# Patient Record
Sex: Female | Born: 1935 | Race: Black or African American | Hispanic: No | Marital: Married
Health system: Midwestern US, Academic
[De-identification: ages and names within clinical notes are randomized; demographics above are authoritative.]

---

## 2018-03-18 ENCOUNTER — Emergency Department: Admit: 2018-03-18 | Discharge: 2018-03-18 | Payer: MEDICARE

## 2018-03-18 ENCOUNTER — Inpatient Hospital Stay
Admit: 2018-03-18 | Discharge: 2018-03-21 | Disposition: A | Discharge status: Home Health | Payer: MEDICARE | Admitting: Student in an Organized Health Care Education/Training Program

## 2018-03-18 ENCOUNTER — Encounter: Admit: 2018-03-18 | Discharge: 2018-03-18 | Payer: MEDICARE

## 2018-03-18 DIAGNOSIS — C8190 Hodgkin lymphoma, unspecified, unspecified site: ICD-10-CM

## 2018-03-18 DIAGNOSIS — I4892 Unspecified atrial flutter: Principal | ICD-10-CM

## 2018-03-18 DIAGNOSIS — I10 Essential (primary) hypertension: ICD-10-CM

## 2018-03-18 DIAGNOSIS — R531 Weakness: Principal | ICD-10-CM

## 2018-03-18 DIAGNOSIS — M1990 Unspecified osteoarthritis, unspecified site: ICD-10-CM

## 2018-03-18 DIAGNOSIS — E119 Type 2 diabetes mellitus without complications: ICD-10-CM

## 2018-03-19 ENCOUNTER — Encounter: Admit: 2018-03-19 | Discharge: 2018-03-19 | Payer: MEDICARE

## 2018-03-19 DIAGNOSIS — C8190 Hodgkin lymphoma, unspecified, unspecified site: ICD-10-CM

## 2018-03-19 DIAGNOSIS — E119 Type 2 diabetes mellitus without complications: ICD-10-CM

## 2018-03-19 DIAGNOSIS — M1990 Unspecified osteoarthritis, unspecified site: ICD-10-CM

## 2018-03-19 DIAGNOSIS — I10 Essential (primary) hypertension: ICD-10-CM

## 2018-03-19 DIAGNOSIS — I4892 Unspecified atrial flutter: Principal | ICD-10-CM

## 2018-03-19 LAB — LIPID PROFILE
Lab: 124 mg/dL
Lab: 13 mg/dL
Lab: 208 mg/dL — ABNORMAL HIGH (ref <200)
Lab: 65 mg/dL (ref <150)
Lab: 84 mg/dL (ref >40)
Lab: 91 mg/dL (ref <100)

## 2018-03-19 LAB — URINALYSIS DIPSTICK
Lab: 7 mg/dL (ref 5.0–8.0)
Lab: NEGATIVE MMOL/L (ref 21–30)
Lab: NEGATIVE U/L (ref 7–40)
Lab: NEGATIVE U/L (ref 7–56)
Lab: NEGATIVE mg/dL (ref 0.3–1.2)
Lab: NEGATIVE mg/dL (ref 0.4–1.00)
Lab: NEGATIVE mg/dL (ref 8.5–10.6)

## 2018-03-19 LAB — CBC AND DIFF
Lab: 0.1 10*3/uL (ref 0–0.20)
Lab: 0.3 10*3/uL (ref 0–0.45)
Lab: 7.2 10*3/uL (ref 4.5–11.0)
Lab: 0.1 10*3/uL (ref 0–0.45)
Lab: 0.8 10*3/uL (ref 0–0.80)
Lab: 1 % (ref 0–2)
Lab: 1.8 10*3/uL (ref 1.0–4.8)
Lab: 10 FL (ref 7–11)
Lab: 12 % (ref 4–12)
Lab: 12 g/dL (ref 12.0–15.0)
Lab: 14 % (ref 11–15)
Lab: 172 10*3/uL (ref 150–400)
Lab: 2 % (ref 0–5)
Lab: 28 % (ref 24–44)
Lab: 3.7 10*3/uL (ref 1.8–7.0)
Lab: 3.8 M/UL — ABNORMAL LOW (ref 4.0–5.0)
Lab: 32 g/dL (ref 32.0–36.0)
Lab: 57 % (ref 41–77)
Lab: 6.4 10*3/uL (ref 4.5–11.0)
Lab: 97 FL (ref 80–100)

## 2018-03-19 LAB — TROPONIN-I
Lab: 0 ng/mL (ref 0.0–0.05)
Lab: 0 ng/mL (ref 0.0–0.05)
Lab: 0 ng/mL (ref 0.0–0.05)
Lab: 0 ng/mL (ref 0.0–0.05)

## 2018-03-19 LAB — URINALYSIS, MICROSCOPIC

## 2018-03-19 LAB — COMPREHENSIVE METABOLIC PANEL
Lab: 11 10*3/uL (ref 3–12)
Lab: 137 MMOL/L (ref 137–147)
Lab: >60 mL/min (ref >60)
Lab: >60 mL/min (ref >60)

## 2018-03-19 LAB — POC GLUCOSE
Lab: 137 mg/dL — ABNORMAL HIGH (ref 70–100)
Lab: 230 mg/dL — ABNORMAL HIGH (ref 70–100)
Lab: 166 mg/dL — ABNORMAL HIGH (ref 70–100)

## 2018-03-19 MED ORDER — HEPARIN, PORCINE (PF) 5,000 UNIT/0.5 ML IJ SYRG
5000 [IU] | SUBCUTANEOUS | 0 refills | Status: DC
Start: 2018-03-19 — End: 2018-03-21
  Administered 2018-03-19 – 2018-03-20 (×5): 5000 [IU] via SUBCUTANEOUS

## 2018-03-19 MED ORDER — SENNOSIDES-DOCUSATE SODIUM 8.6-50 MG PO TAB
1 | Freq: Two times a day (BID) | ORAL | 0 refills | Status: DC
Start: 2018-03-19 — End: 2018-03-21
  Administered 2018-03-19 – 2018-03-20 (×2): 1 via ORAL

## 2018-03-19 MED ORDER — ATENOLOL 25 MG PO TAB
12.5 mg | Freq: Every day | ORAL | 0 refills | Status: DC
Start: 2018-03-19 — End: 2018-03-19
  Administered 2018-03-19: 15:00:00 12.5 mg via ORAL

## 2018-03-19 MED ORDER — METFORMIN 500 MG PO TAB
500 mg | Freq: Two times a day (BID) | ORAL | 0 refills | Status: DC
Start: 2018-03-19 — End: 2018-03-21
  Administered 2018-03-19 – 2018-03-20 (×4): 500 mg via ORAL

## 2018-03-19 MED ORDER — POLYETHYLENE GLYCOL 3350 17 GRAM PO PWPK
1 | Freq: Every day | ORAL | 0 refills | Status: DC
Start: 2018-03-19 — End: 2018-03-21

## 2018-03-19 MED ORDER — ASPIRIN 81 MG PO TBEC
81 mg | Freq: Every day | ORAL | 0 refills | Status: DC
Start: 2018-03-19 — End: 2018-03-21
  Administered 2018-03-19 – 2018-03-20 (×2): 81 mg via ORAL

## 2018-03-19 MED ORDER — ACETAMINOPHEN 325 MG PO TAB
650 mg | ORAL | 0 refills | Status: DC | PRN
Start: 2018-03-19 — End: 2018-03-21
  Administered 2018-03-19 – 2018-03-20 (×5): 650 mg via ORAL

## 2018-03-19 MED ORDER — INSULIN ASPART 100 UNIT/ML SC FLEXPEN
0-6 [IU] | Freq: Before meals | SUBCUTANEOUS | 0 refills | Status: DC
Start: 2018-03-19 — End: 2018-03-21
  Administered 2018-03-19: 19:00:00 2 [IU] via SUBCUTANEOUS

## 2018-03-19 MED ORDER — LACTATED RINGERS IV SOLP
INTRAVENOUS | 0 refills | Status: DC
Start: 2018-03-19 — End: 2018-03-21
  Administered 2018-03-19 – 2018-03-20 (×3): 1000.000 mL via INTRAVENOUS

## 2018-03-20 LAB — POC GLUCOSE
Lab: 138 mg/dL — ABNORMAL HIGH (ref 70–100)
Lab: 140 mg/dL — ABNORMAL HIGH (ref 70–100)
Lab: 159 mg/dL — ABNORMAL HIGH (ref 70–100)
Lab: 131 mg/dL — ABNORMAL HIGH (ref 70–100)

## 2018-03-20 LAB — COMPREHENSIVE METABOLIC PANEL: Lab: 139 MMOL/L — ABNORMAL HIGH (ref 137–147)

## 2018-03-20 LAB — CBC AND DIFF: Lab: 5.6 K/UL — ABNORMAL LOW (ref 4.5–11.0)

## 2018-03-20 MED ORDER — METFORMIN 500 MG PO TAB
500 mg | ORAL_TABLET | Freq: Two times a day (BID) | ORAL | 0 refills | Status: CN
Start: 2018-03-20 — End: ?

## 2018-03-20 MED ORDER — ATENOLOL 25 MG PO TAB
12.5 mg | Freq: Every day | ORAL | 0 refills | Status: DC
Start: 2018-03-20 — End: 2018-03-21
  Administered 2018-03-20: 18:00:00 12.5 mg via ORAL

## 2018-03-20 MED ORDER — DICLOFENAC SODIUM 1 % TP GEL
2 g | Freq: Three times a day (TID) | TOPICAL | 0 refills | 19.00000 days | Status: AC
Start: 2018-03-20 — End: ?

## 2018-03-20 MED ORDER — DICLOFENAC SODIUM 1 % TP GEL
2 g | Freq: Three times a day (TID) | TOPICAL | 0 refills | Status: DC
Start: 2018-03-20 — End: 2018-03-21
  Administered 2018-03-20: 21:00:00 2 g via TOPICAL

## 2018-03-20 MED ORDER — ATENOLOL 25 MG PO TAB
12.5 mg | ORAL_TABLET | Freq: Every day | ORAL | 1 refills | 33.00000 days | Status: AC
Start: 2018-03-20 — End: 2018-04-22

## 2018-03-20 MED ORDER — ASPIRIN 81 MG PO TBEC
81 mg | ORAL_TABLET | Freq: Every day | ORAL | 0 refills | Status: CN
Start: 2018-03-20 — End: ?

## 2018-03-21 DIAGNOSIS — K5900 Constipation, unspecified: ICD-10-CM

## 2018-03-21 DIAGNOSIS — I4892 Unspecified atrial flutter: ICD-10-CM

## 2018-03-21 DIAGNOSIS — Z66 Do not resuscitate: ICD-10-CM

## 2018-03-21 DIAGNOSIS — R531 Weakness: Principal | ICD-10-CM

## 2018-03-21 DIAGNOSIS — Z8571 Personal history of Hodgkin lymphoma: ICD-10-CM

## 2018-03-21 DIAGNOSIS — M47812 Spondylosis without myelopathy or radiculopathy, cervical region: ICD-10-CM

## 2018-03-21 DIAGNOSIS — N3946 Mixed incontinence: ICD-10-CM

## 2018-03-21 DIAGNOSIS — I10 Essential (primary) hypertension: ICD-10-CM

## 2018-03-21 DIAGNOSIS — T447X5A Adverse effect of beta-adrenoreceptor antagonists, initial encounter: ICD-10-CM

## 2018-03-21 DIAGNOSIS — G8929 Other chronic pain: ICD-10-CM

## 2018-03-21 DIAGNOSIS — E119 Type 2 diabetes mellitus without complications: ICD-10-CM

## 2018-04-02 ENCOUNTER — Encounter: Admit: 2018-04-02 | Discharge: 2018-04-02 | Payer: MEDICARE

## 2018-04-03 ENCOUNTER — Encounter: Admit: 2018-04-03 | Discharge: 2018-04-03 | Payer: MEDICARE

## 2018-04-03 DIAGNOSIS — C8190 Hodgkin lymphoma, unspecified, unspecified site: ICD-10-CM

## 2018-04-03 DIAGNOSIS — M1990 Unspecified osteoarthritis, unspecified site: ICD-10-CM

## 2018-04-03 DIAGNOSIS — I10 Essential (primary) hypertension: ICD-10-CM

## 2018-04-03 DIAGNOSIS — R42 Dizziness and giddiness: ICD-10-CM

## 2018-04-03 DIAGNOSIS — E119 Type 2 diabetes mellitus without complications: ICD-10-CM

## 2018-04-03 DIAGNOSIS — I4892 Unspecified atrial flutter: Principal | ICD-10-CM

## 2018-04-19 ENCOUNTER — Ambulatory Visit: Admit: 2018-04-19 | Discharge: 2018-04-19 | Payer: MEDICARE

## 2018-04-19 ENCOUNTER — Encounter: Admit: 2018-04-19 | Discharge: 2018-04-19 | Payer: MEDICARE

## 2018-04-19 DIAGNOSIS — Z0000 Encounter for general adult medical examination without abnormal findings: ICD-10-CM

## 2018-04-19 DIAGNOSIS — R5383 Other fatigue: Secondary | ICD-10-CM

## 2018-04-19 DIAGNOSIS — R0989 Other specified symptoms and signs involving the circulatory and respiratory systems: Secondary | ICD-10-CM

## 2018-04-19 DIAGNOSIS — E559 Vitamin D deficiency, unspecified: Secondary | ICD-10-CM

## 2018-04-19 DIAGNOSIS — I4892 Unspecified atrial flutter: Secondary | ICD-10-CM

## 2018-04-19 DIAGNOSIS — I10 Essential (primary) hypertension: Secondary | ICD-10-CM

## 2018-04-19 DIAGNOSIS — R42 Dizziness and giddiness: Secondary | ICD-10-CM

## 2018-04-19 DIAGNOSIS — C8190 Hodgkin lymphoma, unspecified, unspecified site: Secondary | ICD-10-CM

## 2018-04-19 DIAGNOSIS — M1990 Unspecified osteoarthritis, unspecified site: Secondary | ICD-10-CM

## 2018-04-19 DIAGNOSIS — E119 Type 2 diabetes mellitus without complications: Secondary | ICD-10-CM

## 2018-04-19 DIAGNOSIS — E785 Hyperlipidemia, unspecified: Secondary | ICD-10-CM

## 2018-04-19 LAB — CBC AND DIFF
Lab: 14 % (ref 11–15)
Lab: 210 K/UL (ref 150–400)
Lab: 31 pg (ref 26–34)
Lab: 32 g/dL (ref 32.0–36.0)
Lab: 39 % (ref 36–45)
Lab: 4 M/UL — ABNORMAL HIGH (ref 4.0–5.0)
Lab: 5.5 K/UL (ref 4.5–11.0)
Lab: 9.5 FL (ref 7–11)

## 2018-04-19 LAB — 25-OH VITAMIN D (D2 + D3): Lab: 31 ng/mL (ref 30–80)

## 2018-04-19 LAB — VITAMIN B12: Lab: 179 pg/mL — ABNORMAL HIGH (ref 180–914)

## 2018-04-19 LAB — COMPREHENSIVE METABOLIC PANEL
Lab: 140 MMOL/L (ref 137–147)
Lab: 4.2 MMOL/L (ref 3.5–5.1)

## 2018-04-19 LAB — TSH WITH FREE T4 REFLEX: Lab: 1.3 uU/mL (ref 0.35–5.00)

## 2018-04-19 MED ORDER — SIMVASTATIN 40 MG PO TAB
40 mg | ORAL_TABLET | Freq: Every evening | ORAL | 3 refills | Status: AC
Start: 2018-04-19 — End: 2018-06-18

## 2018-04-22 ENCOUNTER — Encounter: Admit: 2018-04-22 | Discharge: 2018-04-22 | Payer: MEDICARE

## 2018-04-22 DIAGNOSIS — E119 Type 2 diabetes mellitus without complications: Secondary | ICD-10-CM

## 2018-04-22 DIAGNOSIS — C8190 Hodgkin lymphoma, unspecified, unspecified site: Secondary | ICD-10-CM

## 2018-04-22 DIAGNOSIS — M1990 Unspecified osteoarthritis, unspecified site: Secondary | ICD-10-CM

## 2018-04-22 DIAGNOSIS — I10 Essential (primary) hypertension: Secondary | ICD-10-CM

## 2018-04-22 DIAGNOSIS — R42 Dizziness and giddiness: Secondary | ICD-10-CM

## 2018-04-22 DIAGNOSIS — I4892 Unspecified atrial flutter: Secondary | ICD-10-CM

## 2018-04-23 ENCOUNTER — Encounter: Admit: 2018-04-23 | Discharge: 2018-04-23 | Payer: MEDICARE

## 2018-04-24 ENCOUNTER — Encounter: Admit: 2018-04-24 | Discharge: 2018-04-24 | Payer: MEDICARE

## 2018-04-26 ENCOUNTER — Encounter: Admit: 2018-04-26 | Discharge: 2018-04-26 | Payer: MEDICARE

## 2018-04-26 DIAGNOSIS — I4892 Unspecified atrial flutter: Secondary | ICD-10-CM

## 2018-04-26 DIAGNOSIS — R42 Dizziness and giddiness: Secondary | ICD-10-CM

## 2018-04-26 DIAGNOSIS — I10 Essential (primary) hypertension: Secondary | ICD-10-CM

## 2018-04-26 DIAGNOSIS — E119 Type 2 diabetes mellitus without complications: Secondary | ICD-10-CM

## 2018-04-26 DIAGNOSIS — C8190 Hodgkin lymphoma, unspecified, unspecified site: Secondary | ICD-10-CM

## 2018-04-26 DIAGNOSIS — M1990 Unspecified osteoarthritis, unspecified site: Secondary | ICD-10-CM

## 2018-04-27 ENCOUNTER — Ambulatory Visit: Admit: 2018-04-26 | Discharge: 2018-04-27 | Payer: MEDICARE

## 2018-04-27 DIAGNOSIS — I4510 Unspecified right bundle-branch block: Secondary | ICD-10-CM

## 2018-04-27 DIAGNOSIS — I10 Essential (primary) hypertension: Secondary | ICD-10-CM

## 2018-04-27 DIAGNOSIS — R9431 Abnormal electrocardiogram [ECG] [EKG]: Secondary | ICD-10-CM

## 2018-05-09 ENCOUNTER — Encounter: Admit: 2018-05-09 | Discharge: 2018-05-09 | Payer: MEDICARE

## 2018-05-17 ENCOUNTER — Ambulatory Visit: Admit: 2018-05-17 | Discharge: 2018-05-17 | Payer: MEDICARE

## 2018-05-17 ENCOUNTER — Encounter: Admit: 2018-05-17 | Discharge: 2018-05-17 | Payer: MEDICARE

## 2018-05-17 DIAGNOSIS — R0989 Other specified symptoms and signs involving the circulatory and respiratory systems: Secondary | ICD-10-CM

## 2018-05-21 ENCOUNTER — Ambulatory Visit: Admit: 2018-05-21 | Discharge: 2018-05-21 | Payer: MEDICARE

## 2018-05-21 DIAGNOSIS — I10 Essential (primary) hypertension: Principal | ICD-10-CM

## 2018-05-21 DIAGNOSIS — R9431 Abnormal electrocardiogram [ECG] [EKG]: ICD-10-CM

## 2018-05-31 ENCOUNTER — Encounter: Admit: 2018-05-31 | Discharge: 2018-05-31 | Payer: MEDICARE

## 2018-05-31 DIAGNOSIS — E785 Hyperlipidemia, unspecified: Principal | ICD-10-CM

## 2018-05-31 MED ORDER — ROSUVASTATIN 10 MG PO TAB
10 mg | ORAL_TABLET | Freq: Every day | ORAL | 3 refills | 90.00000 days | Status: AC
Start: 2018-05-31 — End: 2018-10-29

## 2018-05-31 NOTE — Telephone Encounter
Called and spoke with pt and gave results of her [place] study.  Pt conveyed understanding.  The pt did, however, state that they haven't been taking atorvastatin as directed because of stomach upset.  Wondered if there was an alternative cholesterol med that wouldn't cause nausea.  Routing to advise.  CB Malani Lees LPN

## 2018-06-18 ENCOUNTER — Encounter: Admit: 2018-06-18 | Discharge: 2018-06-18 | Payer: MEDICARE

## 2018-06-18 ENCOUNTER — Ambulatory Visit: Admit: 2018-06-18 | Discharge: 2018-06-19 | Payer: MEDICARE

## 2018-06-18 DIAGNOSIS — I4892 Unspecified atrial flutter: Principal | ICD-10-CM

## 2018-06-18 DIAGNOSIS — R42 Dizziness and giddiness: ICD-10-CM

## 2018-06-18 DIAGNOSIS — E119 Type 2 diabetes mellitus without complications: ICD-10-CM

## 2018-06-18 DIAGNOSIS — M1990 Unspecified osteoarthritis, unspecified site: ICD-10-CM

## 2018-06-18 DIAGNOSIS — I10 Essential (primary) hypertension: ICD-10-CM

## 2018-06-18 DIAGNOSIS — C8190 Hodgkin lymphoma, unspecified, unspecified site: ICD-10-CM

## 2018-06-18 MED ORDER — ATENOLOL 25 MG PO TAB
25 mg | ORAL_TABLET | Freq: Two times a day (BID) | ORAL | 3 refills | 33.00000 days | Status: AC
Start: 2018-06-18 — End: ?

## 2018-06-18 NOTE — Progress Notes
Date of Service: 06/18/2018    Julie Dennis is a 84 y.o. female.       HPI   [name] is a very pleasant 84 year old lady, had been followed by Dr. Racheal Patches for many years.    There is history of questionable atrial flutter.  She has had an EKG with [place] in December and in January, both of them have shown sinus rhythm.  [name] does mention palpitations.  They occur frequently, more so in the mornings.  The heart rate is in the high 90s frequently.  She is currently on atenolol at 25 mg once a day, usually taken in the mornings.  She has taken additional atenolol as and when needed.  Recent TSH was within normal limits.     Recent echo has shown mild mitral stenosis due to mitral annular calcification with normal LV systolic function.    Her exertional capacity is limited due to severe C-spine issues.  She ambulates slowly with the help of a cane.             Vitals:    06/18/18 1321   BP: 104/58   BP Source: Arm, Left Upper   Pulse: 103   SpO2: 96%   Weight: 56.7 kg (125 lb)   Height: 1.676 m (5' 6)   PainSc: Zero     Body mass index is 20.18 kg/m???.     Past Medical History  Patient Active Problem List    Diagnosis Date Noted   ??? Decreased pulses in feet 04/19/2018   ??? Dizziness 04/03/2018   ??? Cervical spine arthritis 03/19/2018   ??? Essential hypertension 03/19/2018   ??? History of atrial flutter 03/19/2018   ??? Type 2 diabetes mellitus (HCC) 03/19/2018   ??? Constipation 03/19/2018   ??? Weakness 03/19/2018   ??? Mixed stress and urge urinary incontinence 03/19/2018         Review of Systems   Constitution: Negative.   HENT: Negative.    Eyes: Negative.    Cardiovascular: Negative.    Respiratory: Negative.    Endocrine: Negative.    Hematologic/Lymphatic: Negative.    Skin: Negative.    Musculoskeletal: Positive for neck pain.   Gastrointestinal: Positive for constipation.   Genitourinary: Negative.    Neurological: Negative.    Psychiatric/Behavioral: Negative.    Allergic/Immunologic: Negative.

## 2018-06-19 DIAGNOSIS — R002 Palpitations: Principal | ICD-10-CM

## 2018-06-19 DIAGNOSIS — Z8679 Personal history of other diseases of the circulatory system: ICD-10-CM

## 2018-07-02 ENCOUNTER — Encounter: Admit: 2018-07-02 | Discharge: 2018-07-02 | Payer: MEDICARE

## 2018-07-02 MED ORDER — GLYBURIDE 2.5 MG PO TAB
2.5 mg | ORAL_TABLET | Freq: Every day | ORAL | 1 refills | Status: AC
Start: 2018-07-02 — End: 2018-12-06

## 2018-08-28 ENCOUNTER — Encounter: Admit: 2018-08-28 | Discharge: 2018-08-28 | Payer: MEDICARE

## 2018-08-28 MED ORDER — LINAGLIPTIN 5 MG PO TAB
5 mg | ORAL_TABLET | Freq: Every day | ORAL | 1 refills | 30.00000 days | Status: DC
Start: 2018-08-28 — End: 2018-10-29

## 2018-08-28 NOTE — Telephone Encounter
Pt called to ask for refill on Tradjenta 5mg  tab    Last seen 04/19/2018  Next scheduled visit 10/21/2018    Routed to provider

## 2018-09-05 ENCOUNTER — Encounter: Admit: 2018-09-05 | Discharge: 2018-09-05 | Payer: MEDICARE

## 2018-10-08 ENCOUNTER — Encounter: Admit: 2018-10-08 | Discharge: 2018-10-08

## 2018-10-08 NOTE — Telephone Encounter
Pt called to see if she could get in w/ [facility] about some issues she's having w/Toenail fungus and a little leg swelling.   This location is more convenient for her because her husband's PCP is next door so she'd like to coordinate the visits.     I called her back, let her know I understand the convenience factor, but this is really best addressed w/ PCP, there are notes in chart from PCP on 5/21 about treating the fungus, but she needs visit.   She has Int Med appt scheduled on 7/8, I gave her that date/time/provider name.   She's not sure if she is going to keep that appt or not, doesn't want too much exposure to the virus.

## 2018-10-23 ENCOUNTER — Encounter: Admit: 2018-10-23 | Discharge: 2018-10-23

## 2018-10-23 DIAGNOSIS — Z7689 Persons encountering health services in other specified circumstances: Principal | ICD-10-CM

## 2018-10-23 DIAGNOSIS — R42 Dizziness and giddiness: Secondary | ICD-10-CM

## 2018-10-23 DIAGNOSIS — I10 Essential (primary) hypertension: Secondary | ICD-10-CM

## 2018-10-23 DIAGNOSIS — E1169 Type 2 diabetes mellitus with other specified complication: Secondary | ICD-10-CM

## 2018-10-23 DIAGNOSIS — I4892 Unspecified atrial flutter: Secondary | ICD-10-CM

## 2018-10-23 DIAGNOSIS — E119 Type 2 diabetes mellitus without complications: Secondary | ICD-10-CM

## 2018-10-23 DIAGNOSIS — C8190 Hodgkin lymphoma, unspecified, unspecified site: Secondary | ICD-10-CM

## 2018-10-23 DIAGNOSIS — Z8679 Personal history of other diseases of the circulatory system: Secondary | ICD-10-CM

## 2018-10-23 DIAGNOSIS — M1990 Unspecified osteoarthritis, unspecified site: Secondary | ICD-10-CM

## 2018-10-23 DIAGNOSIS — B351 Tinea unguium: Secondary | ICD-10-CM

## 2018-10-23 MED ORDER — CICLOPIROX 8 % TP SOLN
6.6 mL | Freq: Every evening | TOPICAL | 0 refills | Status: AC
Start: 2018-10-23 — End: ?

## 2018-10-23 NOTE — Progress Notes
Obtained patient's, or patient proxy's, verbal consent to treat them and their agreement to [facility] financial policy and NPP via this telehealth visit during the [facility] Emergency    Chief Complaint   Patient presents with   ??? Establish Care           History of Present Illness    Julie Dennis is 84 y.o. female patient who presents to clinic to establish care.   TOC from Dr. Clayton Lefort.      Patient has PMH of:  DM II- non-insulin dependent, DLD,  HTN, symptomatic sinus tachycardia- possible atrial flutter- following Cardiology.  She is on atenolol.  Denies any dizziness.      Reports compliance with diabetic medications.  Has not had A1c checked in a year.  Has not had eye exam or foot exam yet.  Would like to see Podiatry.  She had toenail fungus an this has fallen off.  She has been soaking her toes in vinegar and water.      She would like labs done at [facility] and [facility].        Medical History:   Diagnosis Date   ??? Arthritis    ??? Atrial flutter (HCC)    ??? Dizziness 04/03/2018   ??? DM (diabetes mellitus) (HCC)    ??? Hodgkin's lymphoma (HCC)    ??? Hypertension        No past surgical history on file.    family history includes Cancer-Lung in her father; High Cholesterol in her mother; Hypertension in her father and mother.    Social History     Socioeconomic History   ??? Marital status: Married     Spouse name: Not on file   ??? Number of children: Not on file   ??? Years of education: Not on file   ??? Highest education level: Not on file   Occupational History   ??? Not on file   Tobacco Use   ??? Smoking status: Never Smoker   ??? Smokeless tobacco: Never Used   Substance and Sexual Activity   ??? Alcohol use: Not Currently     Frequency: Never   ??? Drug use: Never   ??? Sexual activity: Not on file   Other Topics Concern   ??? Not on file   Social History Narrative   ??? Not on file       Social History     Tobacco Use   ??? Smoking status: Never Smoker   ??? Smokeless tobacco: Never Used   Substance Use Topics ??? Alcohol use: Not Currently     Frequency: Never          Review of Systems   Constitutional: Negative for chills and fever.   HENT: Negative for congestion.    Respiratory: Negative for shortness of breath.    Cardiovascular: Negative for chest pain and palpitations.   Gastrointestinal: Negative for abdominal distention, abdominal pain, constipation, diarrhea and nausea.   Skin:        Toenail change   Neurological: Negative for dizziness and light-headedness.         Objective:         ??? acetaminophen SR (TYLENOL) 650 mg tablet Take 650 mg by mouth every 6 hours as needed for Pain.   ??? aspirin EC 81 mg tablet Take 81 mg by mouth twice daily. Take with food.    ??? atenoloL (TENORMIN) 25 mg tablet Take one tablet by mouth twice daily.   ???  cholecalciferol (VITAMIN D) 1,000 units tablet Take 1,000 Units by mouth. 3-4 times weekly   ??? ciclopirox (PENLAC) 8 % topical solution Apply 6.6 mL topically to affected area at bedtime daily.   ??? diclofenac (VOLTAREN) 1 % topical gel Apply two g topically to affected area three times daily.   ??? FLAXSEED PO Take 2,800 mg by mouth daily.   ??? glucosamine 500 mg tab Take 500 mg by mouth daily.   ??? glyBURIDE (DIABETA) 2.5 mg tablet Take one tablet by mouth daily with breakfast. Take with food.   ??? latanoprost (XALATAN) 0.005 % ophthalmic solution    ??? linaGLIPtin (TRADJENTA) 5 mg tablet Take one tablet by mouth daily.   ??? menthol (BIOFREEZE (MENTHOL)) 4 % gel Apply  topically to affected area as Needed.   ??? metFORMIN-ER (FORTAMET) 500 mg extended release tablet Take 500 mg by mouth twice daily. Indications: Takes once daily   ??? MULTIVITAMIN PO Take 1 tablet by mouth daily.   ??? rosuvastatin (CRESTOR) 10 mg tablet Take one tablet by mouth daily.   ??? senna/docusate (SENOKOT-S) 8.6/50 mg tablet Take 2 tablets by mouth twice daily.     Vitals:    10/23/18 1039   BP: 136/73   Temp: 36.7 ???C (98 ???F)   TempSrc: Oral   Weight: 54.4 kg (120 lb)   Height: 165.1 cm (65)   PainSc: Zero Vitals:    10/23/18 1039   BP: 136/73   Temp: 36.7 ???C (98 ???F)   TempSrc: Oral   Weight: 54.4 kg (120 lb)   Height: 165.1 cm (65)   PainSc: Zero       Body mass index is 19.97 kg/m???.     Physical Exam  Constitutional:       General: She is not in acute distress.     Appearance: Normal appearance. She is well-developed. She is not ill-appearing, toxic-appearing or diaphoretic.   HENT:      Head: Normocephalic and atraumatic.      Nose: Nose normal.   Eyes:      Conjunctiva/sclera: Conjunctivae normal.   Neck:      Musculoskeletal: Normal range of motion.   Pulmonary:      Effort: Pulmonary effort is normal.   Musculoskeletal: Normal range of motion.   Skin:     General: Skin is dry.   Neurological:      Mental Status: She is alert and oriented to person, place, and time.   Psychiatric:         Behavior: Behavior normal.         Labwork reviewed:  Lab Results   Component Value Date/Time    TSH 1.37 04/19/2018 01:25 PM    CHOL 208 (H) 03/19/2018 02:50 AM    TRIG 65 03/19/2018 02:50 AM    HDL 84 03/19/2018 02:50 AM    LDL 91 03/19/2018 02:50 AM    NA 140 04/19/2018 01:25 PM    K 4.2 04/19/2018 01:25 PM    CL 103 04/19/2018 01:25 PM    CO2 29 04/19/2018 01:25 PM    GAP 8 04/19/2018 01:25 PM    BUN 21 04/19/2018 01:25 PM    CR 0.79 04/19/2018 01:25 PM    GLU 203 (H) 04/19/2018 01:25 PM    CA 9.4 04/19/2018 01:25 PM    ALBUMIN 4.2 04/19/2018 01:25 PM    TOTPROT 7.3 04/19/2018 01:25 PM    ALKPHOS 63 04/19/2018 01:25 PM    AST 25 04/19/2018 01:25 PM  ALT 19 04/19/2018 01:25 PM    TOTBILI 0.4 04/19/2018 01:25 PM    GFR >60 04/19/2018 01:25 PM    GFRAA >60 04/19/2018 01:25 PM            Assessment and Plan:  Julie Dennis is a 84 y.o. female who presents 10/23/2018 for   Chief Complaint   Patient presents with   ??? Establish Care      Julie Dennis was seen today for establish care.    Diagnoses and all orders for this visit:    Encounter to establish care    Essential hypertension -     COMPREHENSIVE METABOLIC PANEL; Future; Expected date: 10/23/2018    Type 2 diabetes mellitus with other specified complication, unspecified whether long term insulin use (HCC)  -     HEMOGLOBIN A1C; Future; Expected date: 10/23/2018  -     MICROALB/CR RATIO-URINE RANDOM; Future; Expected date: 10/23/2018  -     AMB REFERRAL TO PODIATRY-EXTERNAL  - she is on metformin and Trajenda.      History of atrial flutter  - following Cardiology, on atenolol, not on AC    Onychomycosis  -     AMB REFERRAL TO PODIATRY-EXTERNAL  - patient reports has improved since vinegar soaks  - does not want to have to go to lab for monitoring of labs on terbinafine  -     ciclopirox (PENLAC) 8 % topical solution; Apply 6.6 mL topically to affected area at bedtime daily.    Hyperlipidemia, unspecified hyperlipidemia type  -     LIPID PROFILE; Future; Expected date: 10/23/2018    PAD (peripheral artery disease) (HCC)  - mild, on statin and ASA    Total time 40 minutes.  Estimated counseling time 25 minutes.      Patient Instructions   Thank you meeting with me virtually today.  I appreciate the opportunity to care for you.     Our plan from this visit:  ? Fasting labs  ? Podiatry referral      The phone number for my nurse, Luther Parody is [phone].  Our Fax number is [phone].  If you need to schedule a Same Day Visit with me for a sick visit, please call [phone]- option [phone].    You can also reach me by sending an email message through MyChart email that links directly to your chart.  If you are on Mychart patient portal, please expect to receive your results and advice about your results on Mychart at [email or web address]  If you have multiple questions or would like to have a discussion regarding lab results or further treatment, please call to create an appointment so we can discuss in person as your time is important to me as is my other patients' time.      If you are not on Mychart patient portal, non urgent results will be relayed to you through a letter.  Urgent results will be called to you.  Please make sure your address and phone number are correct.       Your time is important and if you had to wait at all today, I apologize. My goal is to run exactly on time; however, on occasion, I get behind in clinic due to unexpected patient issues.      Take care,  Dr. Gerhard Munch, MD and the General Internal Medicine Clinic, [facility]. [phone]    Podiatry information:  ???  ???  [facility]  [address]  [facility] [address]  [facility]   [place] [address]  [phone]  ???  [facility]   Dr. Dot Lanes   [address]   [place], [place] [address]   [phone]  ???  Podiatry associates  Dr. Benjaman Pott and Dr. Mervyn Skeeters [facility]   [facility]   [place] [facility]  [address]  [place], [place] [address]    [phone]  ???  Podiatric Physician and Foot and Ankle Surgeon   Dr Bonnell Public and Dr. Patrcia Dolly  [address]   [place], [place] [address]   [phone]  ???  [facility]  [address], [place]  [place]  [facility]   [address], [place], [place]   [address] [facility]  ???  [facility]  [address]  [place], [place] [address]   [phone]  ???  [facility]   [phone]  [place], [place] [address]  Brion Aliment, MD   ???  [facility]   [address] Freada Bergeron [address] [place], [phone]  [place], Mordecai Maes DPM        Return in about 3 months (around 01/23/2019) for Follow-up 40 min, Either Telehealth or Office visit OK.         Dierdre Harness, MD  [facility]

## 2018-10-24 ENCOUNTER — Ambulatory Visit: Admit: 2018-10-23 | Discharge: 2018-10-24

## 2018-10-24 DIAGNOSIS — E785 Hyperlipidemia, unspecified: Secondary | ICD-10-CM

## 2018-10-24 DIAGNOSIS — I739 Peripheral vascular disease, unspecified: Secondary | ICD-10-CM

## 2018-10-27 ENCOUNTER — Encounter: Admit: 2018-10-27 | Discharge: 2018-10-27

## 2018-10-27 DIAGNOSIS — R42 Dizziness and giddiness: Secondary | ICD-10-CM

## 2018-10-27 DIAGNOSIS — E119 Type 2 diabetes mellitus without complications: Secondary | ICD-10-CM

## 2018-10-27 DIAGNOSIS — C8190 Hodgkin lymphoma, unspecified, unspecified site: Secondary | ICD-10-CM

## 2018-10-27 DIAGNOSIS — I4892 Unspecified atrial flutter: Secondary | ICD-10-CM

## 2018-10-27 DIAGNOSIS — M1990 Unspecified osteoarthritis, unspecified site: Secondary | ICD-10-CM

## 2018-10-27 DIAGNOSIS — I10 Essential (primary) hypertension: Secondary | ICD-10-CM

## 2018-10-29 ENCOUNTER — Encounter: Admit: 2018-10-29 | Discharge: 2018-10-29

## 2018-10-29 DIAGNOSIS — E785 Hyperlipidemia, unspecified: Secondary | ICD-10-CM

## 2018-10-29 MED ORDER — ROSUVASTATIN 10 MG PO TAB
10 mg | ORAL_TABLET | Freq: Every day | ORAL | 1 refills | 90.00000 days | Status: AC
Start: 2018-10-29 — End: ?

## 2018-10-29 MED ORDER — LINAGLIPTIN 5 MG PO TAB
5 mg | ORAL_TABLET | Freq: Every day | ORAL | 1 refills | 90.00000 days | Status: DC
Start: 2018-10-29 — End: 2018-12-06

## 2018-10-29 NOTE — Telephone Encounter
Refill request from patient for rosuvastatin 10mg  tab.     Last fill: 05/31/2018  Qty/Refill: #30/3  LOV: 10/23/2018  NOV:12/05/2018    Hepatic Function    Lab Results   Component Value Date/Time    ALBUMIN 4.2 04/19/2018 01:25 PM    TOTPROT 7.3 04/19/2018 01:25 PM    ALKPHOS 63 04/19/2018 01:25 PM    Lab Results   Component Value Date/Time    AST 25 04/19/2018 01:25 PM    ALT 19 04/19/2018 01:25 PM    TOTBILI 0.4 04/19/2018 01:25 PM        Last LFT's within normal limits.       Medication on General Medicine protocol list. Will refill today.     Refill request from patient for Tradjenta 5mg  tab    Last fill: 08/28/2018  Qty/Refill: #30/1  LOV: 10/23/2018  NOV:12/05/2018    Medication not on General Medicine protocol list. Will route to Dr. Kathe Becton to approve/deny.        Malva Cogan, RN

## 2018-11-04 ENCOUNTER — Encounter: Admit: 2018-11-04 | Discharge: 2018-11-04

## 2018-11-04 NOTE — Telephone Encounter
Pt called on Saturday to discuss testing. Spoke with pt, she stated she found out information regarding COVID testing and was instructed by health department she does not need to be tested unless she has symptoms. Provided pt wish [place] COVID line to call with further questions about COVID. Pt would like to switch her appt with [facility] to telehealth.

## 2018-11-13 ENCOUNTER — Encounter: Admit: 2018-11-13 | Discharge: 2018-11-13

## 2018-11-13 NOTE — Progress Notes
Left message to make sure pt had zoom app downloaded to smart device being used for telehealth appt with Dr Curly Shores on 11-18-2018 also reminded her that we will call 15 min prior to appt to get started on the visit.

## 2018-11-19 ENCOUNTER — Encounter: Admit: 2018-11-19 | Discharge: 2018-11-19

## 2018-11-19 ENCOUNTER — Emergency Department: Admit: 2018-11-19 | Discharge: 2018-11-19

## 2018-11-19 DIAGNOSIS — G9340 Encephalopathy, unspecified: Secondary | ICD-10-CM

## 2018-11-19 DIAGNOSIS — I480 Paroxysmal atrial fibrillation: Secondary | ICD-10-CM

## 2018-11-19 DIAGNOSIS — M1990 Unspecified osteoarthritis, unspecified site: Secondary | ICD-10-CM

## 2018-11-19 DIAGNOSIS — R4689 Other symptoms and signs involving appearance and behavior: Secondary | ICD-10-CM

## 2018-11-19 DIAGNOSIS — I10 Essential (primary) hypertension: Secondary | ICD-10-CM

## 2018-11-19 DIAGNOSIS — R4182 Altered mental status, unspecified: Principal | ICD-10-CM

## 2018-11-19 DIAGNOSIS — G479 Sleep disorder, unspecified: Secondary | ICD-10-CM

## 2018-11-19 DIAGNOSIS — E119 Type 2 diabetes mellitus without complications: Secondary | ICD-10-CM

## 2018-11-19 DIAGNOSIS — E1169 Type 2 diabetes mellitus with other specified complication: Secondary | ICD-10-CM

## 2018-11-19 DIAGNOSIS — C8190 Hodgkin lymphoma, unspecified, unspecified site: Secondary | ICD-10-CM

## 2018-11-19 DIAGNOSIS — I4892 Unspecified atrial flutter: Secondary | ICD-10-CM

## 2018-11-19 DIAGNOSIS — R42 Dizziness and giddiness: Secondary | ICD-10-CM

## 2018-11-19 DIAGNOSIS — F29 Unspecified psychosis not due to a substance or known physiological condition: Secondary | ICD-10-CM

## 2018-11-19 LAB — OXYCODONE URINE SCREEN: Lab: NEGATIVE

## 2018-11-19 LAB — ACETAMINOPHEN LEVEL: Lab: <10 ug/mL (ref <20.1)

## 2018-11-19 LAB — URINALYSIS DIPSTICK REFLEX TO CULTURE
Lab: NEGATIVE 10*3/uL (ref 0–0.20)
Lab: NEGATIVE mL/min (ref 0–0.80)
Lab: NEGATIVE

## 2018-11-19 LAB — URINALYSIS MICROSCOPIC REFLEX TO CULTURE

## 2018-11-19 LAB — COMPREHENSIVE METABOLIC PANEL
Lab: 139 MMOL/L (ref 137–147)
Lab: 8 g/dL (ref 6.0–8.0)

## 2018-11-19 LAB — POC GLUCOSE
Lab: 193 mg/dL — ABNORMAL HIGH (ref 70–100)
Lab: 122 mg/dL — ABNORMAL HIGH (ref 70–100)

## 2018-11-19 LAB — CBC AND DIFF: Lab: 6.1 10*3/uL (ref 4.5–11.0)

## 2018-11-19 LAB — SALICYLATE LEVEL

## 2018-11-19 LAB — TSH WITH FREE T4 REFLEX: Lab: 1.7 uU/mL — AB (ref 0.35–5.00)

## 2018-11-19 LAB — BETA HYDROXYBUTYRATE (KETONES): Lab: 0.6 MMOL/L — ABNORMAL HIGH (ref <0.3)

## 2018-11-19 LAB — OPIATES 300 OR GREATER-URINE RANDOM: Lab: NEGATIVE

## 2018-11-19 LAB — CANNABINOIDS-URINE RANDOM: Lab: NEGATIVE

## 2018-11-19 LAB — BNP (B-TYPE NATRIURETIC PEPTI): Lab: 247 pg/mL — ABNORMAL HIGH (ref 0–100)

## 2018-11-19 LAB — BENZODIAZEPINES-URINE RANDOM: Lab: NEGATIVE

## 2018-11-19 LAB — ALCOHOL LEVEL: Lab: <10 mg/dL

## 2018-11-19 LAB — COVID-19 (SARS-COV-2) PCR

## 2018-11-19 LAB — METHADONE-URINE SCREEN: Lab: NEGATIVE

## 2018-11-19 LAB — MAGNESIUM: Lab: 1.7 mg/dL (ref 1.6–2.6)

## 2018-11-19 LAB — COCAINE-URINE RANDOM: Lab: NEGATIVE

## 2018-11-19 LAB — POC TROPONIN: Lab: 0 ng/mL (ref 0.00–0.05)

## 2018-11-19 LAB — POC LACTATE: Lab: 1.3 MMOL/L (ref 0.5–2.0)

## 2018-11-19 LAB — AMPHETAMINES-URINE RANDOM: Lab: NEGATIVE

## 2018-11-19 LAB — PHENCYCLIDINES-URINE RANDOM: Lab: NEGATIVE

## 2018-11-19 LAB — BARBITURATES-URINE RANDOM: Lab: NEGATIVE

## 2018-11-19 MED ORDER — ACETAMINOPHEN 325 MG PO TAB
650 mg | ORAL | 0 refills | Status: DC | PRN
Start: 2018-11-19 — End: 2018-11-27
  Administered 2018-11-22 – 2018-11-26 (×3): 650 mg via ORAL

## 2018-11-19 MED ORDER — HYDRALAZINE 20 MG/ML IJ SOLN
10 mg | INTRAVENOUS | 0 refills | Status: DC | PRN
Start: 2018-11-19 — End: 2018-11-20

## 2018-11-19 MED ORDER — CHOLECALCIFEROL (VITAMIN D3) 25 MCG (1,000 UNIT) PO TAB
1000 [IU] | Freq: Every day | ORAL | 0 refills | Status: DC
Start: 2018-11-19 — End: 2018-11-27
  Administered 2018-11-21 – 2018-11-27 (×7): 1000 [IU] via ORAL

## 2018-11-19 MED ORDER — MELATONIN 3 MG PO TAB
3 mg | Freq: Every evening | ORAL | 0 refills | Status: DC | PRN
Start: 2018-11-19 — End: 2018-11-27

## 2018-11-19 MED ORDER — LATANOPROST 0.005 % OP DROP
1 [drp] | Freq: Every evening | OPHTHALMIC | 0 refills | Status: DC
Start: 2018-11-19 — End: 2018-11-27
  Administered 2018-11-20: 03:00:00 1 [drp] via OPHTHALMIC

## 2018-11-19 MED ORDER — LACTATED RINGERS IV SOLP
30 mL/kg | Freq: Once | INTRAVENOUS | 0 refills | Status: CP
Start: 2018-11-19 — End: ?
  Administered 2018-11-19: 15:00:00 1632 mL via INTRAVENOUS

## 2018-11-19 MED ORDER — OLANZAPINE 10 MG IM SOLR
2.5 mg | INTRAMUSCULAR | 0 refills | Status: DC | PRN
Start: 2018-11-19 — End: 2018-11-20

## 2018-11-19 MED ORDER — ATENOLOL 50 MG PO TAB
25 mg | Freq: Two times a day (BID) | ORAL | 0 refills | Status: DC
Start: 2018-11-19 — End: 2018-11-27
  Administered 2018-11-19 – 2018-11-27 (×15): 25 mg via ORAL

## 2018-11-19 MED ORDER — THIAMINE 100MG IVPB
500 mg | Freq: Once | INTRAVENOUS | 0 refills | Status: CP
Start: 2018-11-19 — End: ?
  Administered 2018-11-19 (×2): 500 mg via INTRAVENOUS

## 2018-11-19 MED ORDER — ENOXAPARIN 40 MG/0.4 ML SC SYRG
40 mg | Freq: Every day | SUBCUTANEOUS | 0 refills | Status: DC
Start: 2018-11-19 — End: 2018-11-27
  Administered 2018-11-20 – 2018-11-27 (×7): 40 mg via SUBCUTANEOUS

## 2018-11-19 MED ORDER — ASPIRIN 81 MG PO TBEC
81 mg | Freq: Two times a day (BID) | ORAL | 0 refills | Status: DC
Start: 2018-11-19 — End: 2018-11-27
  Administered 2018-11-21 – 2018-11-27 (×13): 81 mg via ORAL

## 2018-11-19 MED ORDER — DOCUSATE SODIUM 100 MG PO CAP
100 mg | Freq: Every day | ORAL | 0 refills | Status: DC | PRN
Start: 2018-11-19 — End: 2018-11-27
  Administered 2018-11-21 (×2): 100 mg via ORAL

## 2018-11-19 MED ORDER — ROSUVASTATIN 20 MG PO TAB
10 mg | Freq: Every day | ORAL | 0 refills | Status: DC
Start: 2018-11-19 — End: 2018-11-27
  Administered 2018-11-19 – 2018-11-27 (×8): 10 mg via ORAL

## 2018-11-19 MED ORDER — MELATONIN 3 MG PO TAB
3 mg | Freq: Every evening | ORAL | 0 refills | Status: DC
Start: 2018-11-19 — End: 2018-11-27
  Administered 2018-11-20 – 2018-11-27 (×8): 3 mg via ORAL

## 2018-11-19 MED ORDER — SENNOSIDES-DOCUSATE SODIUM 8.6-50 MG PO TAB
2 | Freq: Two times a day (BID) | ORAL | 0 refills | Status: DC
Start: 2018-11-19 — End: 2018-11-27
  Administered 2018-11-20 – 2018-11-27 (×10): 2 via ORAL

## 2018-11-19 MED ORDER — INSULIN ASPART 100 UNIT/ML SC FLEXPEN
0-6 [IU] | Freq: Before meals | SUBCUTANEOUS | 0 refills | Status: DC
Start: 2018-11-19 — End: 2018-11-27
  Administered 2018-11-19: 21:00:00 0 [IU] via SUBCUTANEOUS

## 2018-11-19 MED ORDER — ASPIRIN 81 MG PO TBEC
81 mg | Freq: Two times a day (BID) | ORAL | 0 refills | Status: DC
Start: 2018-11-19 — End: 2018-11-19

## 2018-11-19 MED ORDER — THIAMINE MONONITRATE (VIT B1) 100 MG PO TAB
100 mg | Freq: Every day | ORAL | 0 refills | Status: DC
Start: 2018-11-19 — End: 2018-11-27
  Administered 2018-11-21 – 2018-11-27 (×7): 100 mg via ORAL

## 2018-11-19 MED ORDER — SODIUM CHLORIDE 0.9 % IV SOLP
INTRAVENOUS | 0 refills | Status: DC
Start: 2018-11-19 — End: 2018-11-19
  Administered 2018-11-19: 21:00:00 1000.000 mL via INTRAVENOUS

## 2018-11-19 MED ORDER — ATENOLOL 50 MG PO TAB
25 mg | Freq: Two times a day (BID) | ORAL | 0 refills | Status: DC
Start: 2018-11-19 — End: 2018-11-19

## 2018-11-19 MED ORDER — METOPROLOL TARTRATE 5 MG/5 ML IV SOLN
5 mg | INTRAVENOUS | 0 refills | Status: DC
Start: 2018-11-19 — End: 2018-11-19

## 2018-11-19 MED ORDER — ASPIRIN 325 MG PO TAB
325 mg | Freq: Once | ORAL | 0 refills | Status: CP
Start: 2018-11-19 — End: ?
  Administered 2018-11-19: 23:00:00 325 mg via ORAL

## 2018-11-19 MED ORDER — OLANZAPINE 5 MG PO TBDI
2.5 mg | ORAL | 0 refills | Status: DC | PRN
Start: 2018-11-19 — End: 2018-11-20
  Administered 2018-11-19 – 2018-11-20 (×2): 2.5 mg via ORAL

## 2018-11-19 NOTE — Progress Notes
Telehealth Visit Note    Date of Service: 11/19/2018    Subjective:      Obtained patient's verbal consent to treat them and their agreement to [facility] financial policy and NPP via this telehealth visit during the [facility] Emergency       Julie Dennis is a 84 y.o. female.    History of Present Illness  Patient complains of spastic movement, confusion, AMS, a some falls. Pt son states this has been going on a few weeks. He is unsure if she is taking her meds or her husbands. He is worried about dementia    ROS and PE not completed. Son on [place]. And patient in background walking around. Gait did not appear stable.        Review of Systems      Objective:         ??? acetaminophen SR (TYLENOL) 650 mg tablet Take 650 mg by mouth every 6 hours as needed for Pain.   ??? aspirin EC 81 mg tablet Take 81 mg by mouth twice daily. Take with food.    ??? atenoloL (TENORMIN) 25 mg tablet Take one tablet by mouth twice daily.   ??? cholecalciferol (VITAMIN D) 1,000 units tablet Take 1,000 Units by mouth. 3-4 times weekly   ??? ciclopirox (PENLAC) 8 % topical solution Apply 6.6 mL topically to affected area at bedtime daily.   ??? diclofenac (VOLTAREN) 1 % topical gel Apply two g topically to affected area three times daily.   ??? FLAXSEED PO Take 2,800 mg by mouth daily.   ??? glucosamine 500 mg tab Take 500 mg by mouth daily.   ??? glyBURIDE (DIABETA) 2.5 mg tablet Take one tablet by mouth daily with breakfast. Take with food.   ??? latanoprost (XALATAN) 0.005 % ophthalmic solution    ??? linaGLIPtin (TRADJENTA) 5 mg tablet Take one tablet by mouth daily.   ??? menthol (BIOFREEZE (MENTHOL)) 4 % gel Apply  topically to affected area as Needed.   ??? metFORMIN-ER (FORTAMET) 500 mg extended release tablet Take 500 mg by mouth twice daily. Indications: Takes once daily   ??? MULTIVITAMIN PO Take 1 tablet by mouth daily.   ??? rosuvastatin (CRESTOR) 10 mg tablet Take one tablet by mouth daily. ??? senna/docusate (SENOKOT-S) 8.6/50 mg tablet Take 2 tablets by mouth twice daily.     There were no vitals filed for this visit.  There is no height or weight on file to calculate BMI.     Physical Exam         Assessment and Plan:  There are no diagnoses linked to this encounter.               There are no Patient Instructions on file for this visit.           7 minutes spent on this patient's encounter with counseling and coordination of care taking >50% of the visit.

## 2018-11-19 NOTE — Progress Notes
Patient arrived to room # [id number]*) via cart accompanied by RN. Patient transferred to the bed with assistance. Bedside safety checks completed. Initial patient assessment completed. Refer to flowsheet for details.    Admission skin assessment completed with: Apolonio Schneiders, RN    Pressure injury present on arrival?: Yes    1. Head/Face/Neck: No  2. Trunk/Back: No  3. Upper Extremities: No  4. Lower Extremities: No  5. Pelvic/Coccyx: Yes  6. Assessed for device associated injury? Yes  7. Malnutrition Screening Tool (Nursing Nutrition Assessment) Completed? Yes    See Doc Flowsheet for additional wound details.     INTERVENTIONS:

## 2018-11-19 NOTE — Patient Instructions
Please go to ED or call PCP. Pt needs further evaluation of labs, CT scan, UA to determine if there is an Acute cause for AMS

## 2018-11-19 NOTE — ED Notes
This RN to bedside. Pt is crying and tachycardic. Pt states that she thought that she saw roaches and rats crawling on the walls. This RN reassured pt at this time, pt is now becoming calmer after she was reoriented.

## 2018-11-19 NOTE — ED Notes
Pt now reporting that he dog talks to her as well as angels.

## 2018-11-19 NOTE — ED Notes
This RN to bedside with ED tech. Removed brief, peri care completed and Pure[place] put in place.

## 2018-11-19 NOTE — ED Triage Notes
Pt here today from home via EMS c/c AMS. Pt lives at home and the family states that pt was not acting right. Pt knows name and is now slow to recall date and situation. Pt is tangential and singing songs. Dr. Tereasa Coop at bedside. Pt connected to monitoring, changed into gown, bed in lowest and locked position.     Belongings: nightgown, head wrap, brown hospital style socks    Medical History:   Diagnosis Date    Arthritis     Atrial flutter ([place])     Dizziness 04/03/2018    DM (diabetes mellitus) ([place])     Hodgkin's lymphoma ([place])     Hypertension

## 2018-11-19 NOTE — ED Notes
Pt heard screaming from room. ED staff to bedside. Pt was crying and was having hallucinations again. PRN Zyprexa ordered, will administered when available.

## 2018-11-19 NOTE — ED Notes
Pt has become more lucid and is no longer hallucinating. Pt is continuing to sign and hum to self. Pt updated on plan of care and provided with warm blankets.

## 2018-11-20 LAB — POC GLUCOSE
Lab: 180 mg/dL — ABNORMAL HIGH (ref 70–100)
Lab: 167 mg/dL — ABNORMAL HIGH (ref 70–100)
Lab: 135 mg/dL — ABNORMAL HIGH (ref 70–100)
Lab: 183 mg/dL — ABNORMAL HIGH (ref 70–100)

## 2018-11-20 LAB — TROPONIN-I
Lab: 0 ng/mL (ref 0.0–0.05)
Lab: 0 ng/mL — ABNORMAL HIGH (ref 0.0–0.05)
Lab: 0 ng/mL (ref 0.0–0.05)

## 2018-11-20 LAB — BASIC METABOLIC PANEL
Lab: 0.7 mg/dL (ref 0.4–1.00)
Lab: 141 MMOL/L (ref 137–147)
Lab: 150 mg/dL — ABNORMAL HIGH (ref 70–100)
Lab: 20 mg/dL (ref 7–25)
Lab: 3.3 MMOL/L — ABNORMAL LOW (ref 3.5–5.1)
Lab: >60 mL/min (ref >60)
Lab: 12 (ref 3–12)

## 2018-11-20 LAB — CBC AND DIFF
Lab: 4.8 M/UL — ABNORMAL LOW (ref >60)
Lab: 6.5 K/UL — ABNORMAL LOW (ref >60)

## 2018-11-20 LAB — D-DIMER: Lab: 189 ng{FEU}/mL — ABNORMAL HIGH (ref <500)

## 2018-11-20 LAB — AMMONIA: Lab: 21 umol/L (ref 9–35)

## 2018-11-20 MED ORDER — LACTATED RINGERS IV SOLP
INTRAVENOUS | 0 refills | Status: DC
Start: 2018-11-20 — End: 2018-11-21
  Administered 2018-11-20 – 2018-11-21 (×2): 1000.000 mL via INTRAVENOUS

## 2018-11-20 MED ORDER — OLANZAPINE 5 MG PO TBDI
5-10 mg | ORAL | 0 refills | Status: DC | PRN
Start: 2018-11-20 — End: 2018-11-20

## 2018-11-20 MED ORDER — LORAZEPAM 2 MG/ML IJ SOLN
.5 mg | Freq: Once | INTRAMUSCULAR | 0 refills | Status: CP
Start: 2018-11-20 — End: ?
  Administered 2018-11-20: 20:00:00 0.5 mg via INTRAMUSCULAR

## 2018-11-20 MED ORDER — LORAZEPAM 0.5 MG PO TAB
.5-1 mg | ORAL | 0 refills | Status: DC | PRN
Start: 2018-11-20 — End: 2018-11-27
  Administered 2018-11-24 – 2018-11-25 (×2): 0.5 mg via ORAL

## 2018-11-20 MED ORDER — LORAZEPAM 2 MG/ML IJ SOLN
.5-1 mg | INTRAMUSCULAR | 0 refills | Status: DC | PRN
Start: 2018-11-20 — End: 2018-11-27
  Administered 2018-11-21 (×2): 0.5 mg via INTRAMUSCULAR

## 2018-11-20 MED ORDER — OLANZAPINE 10 MG IM SOLR
2.5 mg | INTRAMUSCULAR | 0 refills | Status: DC | PRN
Start: 2018-11-20 — End: 2018-11-20
  Administered 2018-11-20: 11:00:00 2.5 mg via INTRAMUSCULAR

## 2018-11-20 MED ORDER — POTASSIUM CHLORIDE 20 MEQ PO TBTQ
60 meq | Freq: Once | ORAL | 0 refills | Status: CP
Start: 2018-11-20 — End: ?
  Administered 2018-11-20: 22:00:00 60 meq via ORAL

## 2018-11-20 MED ADMIN — WATER FOR INJECTION, STERILE IJ SOLN [79513]: 20 mL | INTRAMUSCULAR | @ 11:00:00 | Stop: 2018-11-20 | NDC 00409488723

## 2018-11-20 NOTE — Care Coordination-Inpatient
For any questions prior to 8am please call Team Med Private Swing 4, pager [phone]. After 8am contact Team  Med Private P, pager [phone]

## 2018-11-20 NOTE — Care Plan
Shift: Days    Pain: No pain at this time    Neuro: Q4 neuro checks    Nutrition: Probably inadequate; has had a poor appetite today; only ate lunch; supplements have been ordered for B/L/D due to MTS of 3    GI: Last BM PTA but passing gas    GU: Incontinent, wearing pull up brief    Activity: Q2 turn; patient has not been up with nurses only Neuro    Family: Son came to visit this morning for a short time    New Event or Follow Up: Neuro was consulted today; they are recommending an MRI of the head. They also ordered Ativan PRN and discontinued Zyprexa due to patient refusing oral medications. Patient has refused all of her medications today and the team is aware. Lower extremity doppler still pending. Plan is to draw a syphilis screening in the AM 11/21/2018. Patient has pulled out both her left and right AC PIV today. A new PIV 20G was placed in the left [facility] and is infusing LR at 13ml/hr. Sinus Tachy all day.
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
## 2018-11-20 NOTE — Case Management (ED)
Case Management Admission Assessment    NAME:Julie Dennis                          MRN: [id number]             DOB:1934-12-16          AGE: 84 y.o.  ADMISSION DATE: 11/19/2018             DAYS ADMITTED: LOS: 1 day      Today???s Date: 11/20/2018     per emr, Pt here today from home via EMS c/c AMS. Pt lives at home and the family states that pt was not acting right. Pt knows name and is now slow to recall date and situation. Pt is tangential and singing songs.    Source of Information: emr and son Nedra Hai      Plan  Plan: Case Management Assessment, Discharge Planning for Home Anticipated    ??? D/t ongoing precautions to prevent the spread of COVID-19, CM management has asked that face-to-face interaction w/ pt's be limited. Called Pt son after attempted to meet with pt and saw that she was confused. Explained role in r/t d/c planning. Discussed handouts of [facility] brochure, [facility] brochure, Preparing for Discharge Handout and Contact information.   ??? Nedra Hai indicates that he and his wife have been staying with pt and her husband since last week when pt started to become more confused. Pt was the primary cg for her husband who has been total care. Pt is now requiring assist with some adl's to include med setup, some bathing and dressing and homemaking. Pt uses a SPC for ambulation PTa. Pt is unsafe to be left alone at this time at home d/t her level of confusion.   ??? Pt has used [facility] in the past, but family will be staying with pt and no HH indicated at this time. The plan is to move pt/husband to son's home in [place] in the near future. Pt had been turning the son down to move, but now that things have changed with pt, they will plan on moving.   ??? Pt son or dil will provide transport at time of d/c.  ??? Plan: Neuro consult and d/c plan pending on recommendations.   ??? Will follow for d/c assist if indicated.     Patient Address/Phone  [address] [place] [address] [phone] (home)     Emergency Contact  Extended Emergency Contact Information  Primary Emergency Contact: [facility]  Home Phone: [phone]  Mobile Phone: [phone]  Relation: Son  Interpreter needed? No    Healthcare Directive         Transportation  Does the patient need discharge transport arranged?: No  Transportation Name, Phone and Availability #1: son Nedra Hai or his wife will provide transport home [phone]  Does the patient use Medicaid Transportation?: No    Expected Discharge Date  Expected Discharge Date: 11/22/18    Living Situation Prior to Admission  ? Living Arrangements  Type of Residence: Home, dependent on others  Living Arrangements: Children  How many levels in the residence?: 1  Can patient live on one level if needed?: Yes  Does residence have entry and/or side stairs?: Yes  Assistance needed prior to admit or anticipated on discharge: Yes  Who provides assistance or could if needed?: son and dtr in law  Are they in good health?: Yes  Can support  system provide 24/7 care if needed?: Yes  ? Level of Function   Prior level of function: Needs assist with ADLs  Which ADLs require assistance?: homemaking, meals, med setup, transport, some dressing, bathing since new cognition issues  Who assists with ADLs?: family  ? Cognitive Abilities   Cognitive Abilities: Continue to Assess, Unable to participate in decision making    Financial Resources  ? Coverage  Primary Insurance: Medicare  Secondary Insurance: Nurse, learning disability  Additional Coverage: RX    ? Source of Income   Source Of Income: Other retirement income, SSI  ? Financial Assistance Needed?  No reported concerns    Psychosocial Needs  ? Mental Health  Mental Health History: (dementia)  ? Substance Use History  Substance Use History Screen: No  ? Other  na    Current/Previous Services  ? PCP  Dierdre Harness, [phone], [phone]  ? Pharmacy    [facility] [address], [place] [address] [place] [address]  Phone: [phone] Fax: [phone]    ? Durable Psychologist, educational at home: [employer], Grab bars, Wheelchair (manual), [employer], [employer], [employer]  ? Home Health  Receiving home health: In the past  Agency name: [facility]  Would patient use this agency again?: Yes  ? Hemodialysis or Peritoneal Dialysis  Undergoing hemodialysis or peritoneal dialysis: No  ? Tube/Enteral Feeds  Receive tube/enteral feeds: No  ? Infusion  Receive infusions: No  ? Private Duty  Private duty help used: No  ? Home and Community Based Services  Home and community based services: No  ? [facility]     ? Hospice  Hospice: No  ? Outpatient Therapy  PT: No  OT: No  SLP: No  ? Skilled Nursing Facility/Nursing Home  SNF: No  NH: No  ? Inpatient Rehab  IPR: No  ? [facility]     ? Acute Hospital Stay  Acute Hospital Stay: In the past  Was patient's stay within the last 30 days?: No    Gearlean Alf Integrated Nurse Case Manager [facility]  Ph [phone]  Pager [phone]

## 2018-11-20 NOTE — Progress Notes
RT Adult Assessment Note    NAME:Julie Dennis             MRN: [id number]             DOB:04/06/1935          AGE: 84 y.o.  ADMISSION DATE: 11/19/2018             DAYS ADMITTED: LOS: 0 days    RT Treatment Plan:       Protocol Plan: Procedures  PAP: Place a nursing order for "IS Q1h While Awake" for any of Lung Expansion indicators    Additional Comments:    Vital Signs:  Pulse: 84  RR: 16 PER MINUTE  SpO2: 96 %  O2 Device:    Liter Flow:    O2%:    Breath Sounds: Clear (Implies normal)  Respiratory Effort: Non-Labored

## 2018-11-20 NOTE — Progress Notes
Labs drawn and labeled at the bedside.

## 2018-11-20 NOTE — Progress Notes
I have reviewed the notes, assessment, and/or procedures performed by Haley Mason, RN and concur with her/his documentation unless otherwise noted.

## 2018-11-20 NOTE — Progress Notes
Psychiatric Consultation Progress Note    LOS: 1 day    Assessment: Julie Dennis is a 84 y.o. female with a past history of HTN, PAD, DM2, A-flutter not on AC, presents to the ED for further evaluation of unusual behavior. Given initial workup is neg for obvious metabolic abnormality and patient is reportedly progressing in terms of psychotic behavior noted below as well as altered sensorium and impaired recent and remote memory,     1. Acute encephalopathy  2. Delirium   3. Brief Psychotic Disorder    Recommendations:      ??? Only use Olanzapine 2.5 mg Q6H PO or IM prn for agitation as a last resort given prolonged QTc 522  ??? Ativan 0.5 mg once right now given significant agitation. Will see if this is a better option for her to treat agitation  ??? Consider further workup for secondary causes especially in the absence of obvious infection or inciting event. Agree with neurology consult for further assessment  Delirium protocol:    - Avoid benzodiazepines, opiates and anticholinergics if possible as can contribute/cause delirium.    - Frequent reorientation, use of clocks/calendars, verbal reminders of current location and situation.    - Attempt to use home assistive devices: Earing aids/glasses/dentures, etc   - A calm, comfortable environment that includes familiar objects from home.    - Involvement of family members.    - Uninterrupted periods of sleep at night, with low levels of noise and minimal light.    - Open blinds during the day to promote daytime alertness and a regular sleep-wake cycle.    At the time of interview [name] lacks capacity for medical decision making:   - she was unable to express understanding of her current illness and proposed treatment plan, despite explanation in simple lay terms provided in several ways.     - she was unable to incorporate new information and apply it to her current illness.     - she was unable to express full understanding of the risks of leaving AMA. Reviewed EMR. CXR Initial chest radiograph demonstrating biapical pleural parenchymal scarring and hilar retraction. CT head w/o with WMD otherwise unremarkable    Reviewed Laboratory studies. UDS, UA, CMP, CBC largely unremarkable    Seen and discussed with: Dr. Carlena Bjornstad    Please feel free to contact us with any additional questions or concerns by paging the consult team between 8am and 5pm on weekdays and between 8am and 3pm on weekends at [phone]. Otherwise, page the psychiatry resident on call.    --------------------------------------------------------------------------------------------------------------------------------  Subjective: Patient is doing better this morning. She is alert and oriented to person place, and knows the year. Thought it was July 24th. She is reporting some hallucinations with seeing angels in her room. Seems to be reporting fewer delusions while talking to her this morning. Did receive one dose of olanzapine this morning.       Objective:                   Vital Signs:  Current                Vital Signs: 24 Hour Range   BP: 162/79 (08/05 1145)  Temp: 36.8 ???C (98.2 ???F) (08/05 1145)  Pulse: 113 (08/05 1145)  Respirations: 18 PER MINUTE (08/05 1145)  SpO2: 100 % (08/05 1145)  SpO2 Pulse: 98 (08/04 1700) BP: (125-162)/(59-96)   Temp:  [36.3 ???C (97.4 ???F)-36.8 ???C (98.2 ???F)]  Pulse:  [84-120]   Respirations:  [14 PER MINUTE-22 PER MINUTE]   SpO2:  [96 %-100 %]     Intensity Pain Scale (Self Report): (not recorded)      Scheduled Medications:  aspirin EC tablet 81 mg, 81 mg, Oral, BID  atenoloL (TENORMIN) tablet 25 mg, 25 mg, Oral, BID  cholecalciferol (VITAMIN D-3) tablet 1,000 Units, 1,000 Units, Oral, QDAY  enoxaparin (LOVENOX) syringe 40 mg, 40 mg, Subcutaneous, QDAY(21)  insulin aspart U-100 (NOVOLOG FLEXPEN) injection PEN 0-6 Units, 0-6 Units, Subcutaneous, ACHS (22)  latanoprost (XALATAN) 0.005 % ophthalmic solution 1 drop, 1 drop, Both Eyes, QHS melatonin tablet 3 mg, 3 mg, Oral, QHS  rosuvastatin (CRESTOR) tablet 10 mg, 10 mg, Oral, QDAY  senna/docusate (SENOKOT-S) tablet 2 tablet, 2 tablet, Oral, BID  thiamine (VITAMIN B-1) tablet 100 mg, 100 mg, Oral, QDAY        PRN Medications:  acetaminophen Q6H PRN, docusate QDAY PRN, hydrALAZINE Q6H PRN, melatonin QHS PRN, OLANZapine Q6H PRN **OR** OLANZapine Q6H PRN 2.5 mg at 11/20/18 0531      Mental Status Evaluation:    ??? General/Constitutional: Alert, sometimes appears concerned but otherwise in no distress  ??? Eye Contact: good eye contact  ??? Behavior: No abnormal movements, very expressive when talking  ??? Speech: pleasant, appropriate tone and pitch   ??? Mood: good  ??? Affect: Normal  ??? Thought Process: still somewhat tangential but better today   ??? Thought Content: delusional, sees angels in her room   ??? Perception: Impaired  ??? Insight/Judgment: Poor/Poor    ??? Orientation: Oriented to person, place, knows year and thought it was July 24th  ??? Recent and remote memory: impaired  ??? Attention span and concentration: Impaired  ??? Cognition: Impaired  ??? Language: Normal fluency and comprehension  ??? Fund of knowledge and vocabulary: Average  ???  Focused Physical Exam:  ??? Neuro: Non-focal other than noted above  ??? Musculoskeletal: Grossly normal       Ferdinand Cava, MD

## 2018-11-20 NOTE — Progress Notes
Patient refusing all of her medications this morning. Attempted to give patient her medications and she threw them on the floor and said "I dont want to take nothing".

## 2018-11-21 ENCOUNTER — Encounter: Admit: 2018-11-21 | Discharge: 2018-11-21

## 2018-11-21 DIAGNOSIS — R4182 Altered mental status, unspecified: Principal | ICD-10-CM

## 2018-11-21 LAB — VITAMIN B12: Lab: 216 pg/mL — ABNORMAL HIGH (ref 180–914)

## 2018-11-21 LAB — POC GLUCOSE
Lab: 211 mg/dL — ABNORMAL HIGH (ref 70–100)
Lab: 132 mg/dL — ABNORMAL HIGH (ref 70–100)
Lab: 247 mg/dL — ABNORMAL HIGH (ref 70–100)
Lab: 236 mg/dL — ABNORMAL HIGH (ref 70–100)

## 2018-11-21 LAB — C REACTIVE PROTEIN (CRP): Lab: 0.5 mg/dL — ABNORMAL LOW (ref <1.0)

## 2018-11-21 LAB — SYPHILIS AB SCREEN: Lab: NEGATIVE g/dL — ABNORMAL LOW (ref 13.5–16.5)

## 2018-11-21 LAB — SED RATE: Lab: 26 mm/h — ABNORMAL LOW (ref >60)

## 2018-11-21 MED ORDER — GADOBENATE DIMEGLUMINE 529 MG/ML (0.1MMOL/0.2ML) IV SOLN
10 mL | Freq: Once | INTRAVENOUS | 0 refills | Status: CP
Start: 2018-11-21 — End: ?
  Administered 2018-11-21: 23:00:00 10 mL via INTRAVENOUS

## 2018-11-21 MED ORDER — LACTATED RINGERS IV SOLP
500 mL | INTRAVENOUS | 0 refills | Status: CP
Start: 2018-11-21 — End: ?
  Administered 2018-11-21: 21:00:00 500 mL via INTRAVENOUS

## 2018-11-21 MED ORDER — LACTATED RINGERS IV SOLP
INTRAVENOUS | 0 refills | Status: AC
Start: 2018-11-21 — End: ?
  Administered 2018-11-22 – 2018-11-23 (×3): 1000.000 mL via INTRAVENOUS

## 2018-11-21 NOTE — Progress Notes
Patient has thrown food and objects in the room 3 times before 11am but still seems pleasant. Patient has pulled out IV again. Will place another

## 2018-11-21 NOTE — Progress Notes
General Consult Note      Admission Date: 11/19/2018                                                LOS: 2 days    Reason for Consult:  Progressive encephalopathy with no clear medical cause  ???  Consult type: Opinion with orders  ???  Assessment/Plan      Julie. Dennis is a 84yoF, normally high functioning with all ADLs, with history of hodgkin's lymphoma remotely (treated with chemo and radiation), paroxysmal Afib not on AC, mitral stenosis, PAD, DM2 not on insulin, HTN, HLD who presents with several days of new onset altered mental status, including confusion, and visual/auditory hallucinations as well as grandiose thoughts. Per son, may have ingested her spouse's medications for psychiatric issues.   ???  Acute encephalopathy  Delirium superimposed on likely underlying and undiagnosed neurocognitive disorder  Concern for ingestion of psychiatric medications  - Patient demonstrates waxing and waning- was AAOx3 on exam this AM  - neurologic exam: CN 2-12 intact, motor and sensory appear to be intact, coordination difficult to assess because patient did not understand, reflexes intact, gait not tested due to fall risk  - labs have been unremarkable  - CT head with chronic microvascular changes  - tele demonstrates patient has been in ST 90s-110s  - EKG demonstrates QT prolongation >0.5 - improved to ~0.4  - MRI -    1. ???Mild nonspecific subinsular white matter disease with a few   superimposed dilated perivascular spaces or old lacunar infarcts. No acute   or recent infarct is identified.   2. ???Tiny focus of microhemorrhage within the left cerebellar hemisphere,   nonspecific in etiology and likely chronic.  ???  Impression: Patient likely has underlying neurocognitive disorder with superimposed delirium due to unfamiliar environment and possible history of antipsychotic medication ingestion. She is clearly waxing and waning but mental status is improving. Infectious work up has been negative. Other workup significant for QTc that is now normalized and elevated d-dimer ~1800, U/S of legs negative for DVT.   ???  Recommendations:  > EEG performed, will follow up   > Start patient on donepezil 5mg   > Outpatient follow up with dementia/memory clinic for formal neurocognitive testing  > Delirium precautions  > Can consider as indicated additional work up of elevated d-dimer-  Patient reports pleuritic sounding chest pain.     ???  Patient to be discussed and seen with Dr. Chales Abrahams.  ???  Malessa L. Mertha Finders, M.D.  Internal Medicine  PGY-3  Available on Voalte      ______________________________________________________________________    Subjective: Julie Dennis is a 84 y.o. female   NAEON. Julie Dennis reports that she is doing well this PM. She reports that she is no longer seeing angels in the room but she says that they are talking to her sometimes. She at first thinks her name is Naveh Rickles but then remembers her last name is Storrs now (previously married). Reports she is sleeping most of the time.        Scheduled Meds:aspirin EC tablet 81 mg, 81 mg, Oral, BID  atenoloL (TENORMIN) tablet 25 mg, 25 mg, Oral, BID  cholecalciferol (VITAMIN D-3) tablet 1,000 Units, 1,000 Units, Oral, QDAY  enoxaparin (LOVENOX) syringe 40 mg, 40 mg, Subcutaneous, QDAY(21)  insulin aspart U-100 (NOVOLOG FLEXPEN)  injection PEN 0-6 Units, 0-6 Units, Subcutaneous, ACHS (22)  latanoprost (XALATAN) 0.005 % ophthalmic solution 1 drop, 1 drop, Both Eyes, QHS  melatonin tablet 3 mg, 3 mg, Oral, QHS  rosuvastatin (CRESTOR) tablet 10 mg, 10 mg, Oral, QDAY  senna/docusate (SENOKOT-S) tablet 2 tablet, 2 tablet, Oral, BID  thiamine (VITAMIN B-1) tablet 100 mg, 100 mg, Oral, QDAY    Continuous Infusions:  ??? lactated ringers infusion 50 mL/hr at 11/20/18 1700     PRN and Respiratory Meds:acetaminophen Q6H PRN, docusate QDAY PRN, LORazepam Q6H PRN **OR** LORazepam  (ATIVAN)  injection Q6H PRN, melatonin QHS PRN    Review of Systems: A 14 point review of systems was negative except for: improving disorientation        Vital Signs:  Last Filed in 24 hours Vital Signs:  24 hour Range    BP: 141/88 (08/06 0301)  Temp: 36.8 ???C (98.2 ???F) (08/06 0301)  Pulse: 74 (08/06 0407)  Respirations: 20 PER MINUTE (08/06 0301)  SpO2: 100 % (08/06 0301) BP: (129-162)/(58-96)   Temp:  [36.3 ???C (97.4 ???F)-36.8 ???C (98.2 ???F)]   Pulse:  [74-113]   Respirations:  [18 PER MINUTE-20 PER MINUTE]   SpO2:  [96 %-100 %]      Physical Exam:  General: No acute distress, well-appearing.  Eyes: PEERL, EOMI, no scleral icterus or injection, eyelids symmetric without ptosis  HEENT: Atraumatic, normocephalic  Chest: Normal respiratory effort.  Symmetric expansion upon inspiration.   GI: nondistended  Skin: Warm, moist, normal turgor, cap refill < 3 sec  Neuro:   Alert and oriented x3.  Waxing and waning  Follows most commands  Motor intact  Sensory intact  Coordination - BUE intact, however patient did not understand commands for heel to shin test  Reflexes intact  Gait deferred  ???  Psych: Calm and cooperative.  No psychomotor retardation or agitation.  Makes appropriate eye contact.  Speech with normal rate, cadence, volume and articulation. No longer seeing angels but hears them talking to her      Lab/Radiology/Other Diagnostic Tests:  24-hour labs:    Results for orders placed or performed during the hospital encounter of 11/19/18 (from the past 24 hour(s))   POC GLUCOSE    Collection Time: 11/20/18  8:00 AM   Result Value Ref Range    Glucose, POC 167 (H) 70 - 100 MG/DL   POC GLUCOSE    Collection Time: 11/20/18 11:45 AM   Result Value Ref Range    Glucose, POC 135 (H) 70 - 100 MG/DL   CBC AND DIFF    Collection Time: 11/20/18 12:26 PM   Result Value Ref Range    White Blood Cells 6.5 4.5 - 11.0 K/UL    RBC 4.89 4.0 - 5.0 M/UL    Hemoglobin 15.0 12.0 - 15.0 GM/DL    Hematocrit 45.4 (H) 36 - 45 %    MCV 93.6 80 - 100 FL    MCH 30.7 26 - 34 PG    MCHC 32.8 32.0 - 36.0 G/DL RDW 09.8 11 - 15 %    Platelet Count 171 150 - 400 K/UL    MPV 10.7 7 - 11 FL    Neutrophils 55 41 - 77 %    Lymphocytes 31 24 - 44 %    Monocytes 12 4 - 12 %    Eosinophils 1 0 - 5 %    Basophils 1 0 - 2 %    Absolute Neutrophil Count 3.60 1.8 -  7.0 K/UL    Absolute Lymph Count 2.02 1.0 - 4.8 K/UL    Absolute Monocyte Count 0.75 0 - 0.80 K/UL    Absolute Eosinophil Count 0.07 0 - 0.45 K/UL    Absolute Basophil Count 0.06 0 - 0.20 K/UL   TROPONIN-I    Collection Time: 11/20/18 12:26 PM   Result Value Ref Range    Troponin-I 0.02 0.0 - 0.05 NG/ML   BASIC METABOLIC PANEL    Collection Time: 11/20/18  1:50 PM   Result Value Ref Range    Sodium 141 137 - 147 MMOL/L    Potassium 3.3 (L) 3.5 - 5.1 MMOL/L    Chloride 99 98 - 110 MMOL/L    CO2 30 21 - 30 MMOL/L    Anion Gap 12 3 - 12    Glucose 150 (H) 70 - 100 MG/DL    Blood Urea Nitrogen 20 7 - 25 MG/DL    Creatinine 1.61 0.4 - 1.00 MG/DL    Calcium 9.8 8.5 - 09.6 MG/DL    eGFR Non African American >60 >60 mL/min    eGFR African American >60 >60 mL/min   POC GLUCOSE    Collection Time: 11/20/18  5:09 PM   Result Value Ref Range    Glucose, POC 183 (H) 70 - 100 MG/DL   POC GLUCOSE    Collection Time: 11/20/18  9:24 PM   Result Value Ref Range    Glucose, POC 211 (H) 70 - 100 MG/DL     Pertinent radiology reviewed., EKG Reviewed    Myrene Buddy L. Mertha Finders, MD  Pager

## 2018-11-21 NOTE — Progress Notes
OCCUPATIONAL THERAPY  NOTE       Name: Julie Dennis        MRN: [id number]          DOB: 18-Feb-1935          Age: 84 y.o.  Admission Date: 11/19/2018             LOS: 2 days      Orders received and chart reviewed. Per discussion with RN, patient is not appropriate to participate in skilled therapy intervention.Patient is throwing objects and is difficult to redirect. OT/PT will continue to follow and provide intervention as appropriate.     Therapist: Clearence Cheek, OTR/L [phone]  Date: 11/21/2018

## 2018-11-21 NOTE — Progress Notes
ATTESTATION    I personally performed or re-performed the history, physical exam and treatment for the E/M. I discussed the case with the Medical Student, and concur with the Medical Student documentation of history, physical exam and treatment plan unless otherwise noted.      The time of my evaluation she was far more participatory in the examination.  Denied any new symptoms and when not engaged in conversation began seen.  Able to state her name and that she is in an hospital but no further.  Thoughts appeared tangential but overall pleasant.  Religious focus of her speech.    MRI brain with and without contrast is pending.  ESR and CRP unremarkable.  Vitamin B12 elevated.    This afternoon, had intermittent tachycardia, which has been evaluated as an outpatient and for which she is on atenolol twice daily.  She has an existing right bundle branch block.  She remained asymptomatic throughout.  I have low clinical suspicion for pulmonary pathology.    BP 104/61 (BP Source: Arm, Right Upper)  - Pulse 94  - Temp 36.4 ???C (97.6 ???F)  - Wt 54.4 kg (120 lb)  - SpO2 100%  - BMI 19.97 kg/m???      Overall, high clinical suspicion for polypharmacy and inadvertent medication intoxication from her husband psychiatric medications.    Plan  Obtain MRI brain without and with contrast  Continue daily mental status examinations  And weight Neurology input, but agree that there is likely a low yield of a lumbar puncture at this point, we will continue discuss    Staff name:  [name], MD Date:  11/21/2018                  General Progress Note    Name:  Julie Dennis   Today's Date:  11/21/2018  Admission Date: 11/19/2018  LOS: 2 days                     Assessment/Plan:    Principal Problem:    Altered behavior    84 yo F PMH Hodgkins lymphoma, Aflutter not on AC, RBBB, hyperlipidemia, HTN, T2DM admitted after presenting to [facility] clinic appointment with altered mental status evidenced by hallucinations of angels and dogs. Subacute encephalopathy  -stable since yesterday; required no olanzapine/lorazepam overnight  -delirium vs infection vs psychotic disorder vs autoimmune cerebritis  -CBC, CMP, TSH, UA, UDS largely unremarkable. Blood cultures negative so far. CXR/head CT negative for acute changes; CT demonstrates likely small-vessel ischemic disease  -infection less likely given workup  -Psych Consult rec:  ---minimize Olanzapine use given QTc 522; prefer trying Ativan for agitation  ---delirium protocol; frequently reorient, encourage day/night cycle, engage family  ---agree with further workup  -Neuro Consult -- concern for delirium, incorrect med ingestion, malignancy, autoimmune  Plan  -MRI Brain w/wo contrast  -ESR, CRP  -A1c  -B12    Hyperlipidemia  -rosuvastatin 10    Type 2 Diabetes  -sliding scale while in the hospital  -last glucose 211    Hypertension  -on Atenolol 25 BID    Right Bundle Branch Block  -chronic, no concern for ACS    At risk for DVT  -lower extremity doppler US 8/5 no DVT at that time  -enoxaparin ppx  ________________________________________________________________________    Subjective  Julie Dennis is a 84 y.o. female.  Patient is oriented to person, but believes she is in a hospital in [place], [place], [place]. She is  aware it is 2020, but not able to confirm the month or day. She sings and laughs to herself in between questions, but does generally provide a response to a simple, direct, repeated inquiry. When asked why she is in the hospital she mumbled her feet, and then toes, but says they dont hurt. She appears to process very slowly and has significantly impaired cognition. She sees angels everywhere including in this room, but is not distressed by this, as they are smiling at her. She has some insight, as she was very concerned about having to possibly go to a nursing home. Nurse said she had an episode of distress overnight.    Medications Scheduled Meds:aspirin EC tablet 81 mg, 81 mg, Oral, BID  atenoloL (TENORMIN) tablet 25 mg, 25 mg, Oral, BID  cholecalciferol (VITAMIN D-3) tablet 1,000 Units, 1,000 Units, Oral, QDAY  enoxaparin (LOVENOX) syringe 40 mg, 40 mg, Subcutaneous, QDAY(21)  insulin aspart U-100 (NOVOLOG FLEXPEN) injection PEN 0-6 Units, 0-6 Units, Subcutaneous, ACHS (22)  latanoprost (XALATAN) 0.005 % ophthalmic solution 1 drop, 1 drop, Both Eyes, QHS  melatonin tablet 3 mg, 3 mg, Oral, QHS  rosuvastatin (CRESTOR) tablet 10 mg, 10 mg, Oral, QDAY  senna/docusate (SENOKOT-S) tablet 2 tablet, 2 tablet, Oral, BID  thiamine (VITAMIN B-1) tablet 100 mg, 100 mg, Oral, QDAY    Continuous Infusions:  ??? lactated ringers infusion 50 mL/hr at 11/20/18 1700     PRN and Respiratory Meds:acetaminophen Q6H PRN, docusate QDAY PRN, LORazepam Q6H PRN **OR** LORazepam  (ATIVAN)  injection Q6H PRN, melatonin QHS PRN      Review of Systems:  Review of systems not obtained from patient due to patient factors.      Objective:                          Vital Signs: Last Filed                 Vital Signs: 24 Hour Range   BP: 141/88 (08/06 0301)  Temp: 36.8 ???C (98.2 ???F) (08/06 0301)  Pulse: 74 (08/06 0407)  Respirations: 20 PER MINUTE (08/06 0301)  SpO2: 100 % (08/06 0301) BP: (129-162)/(58-96)   Temp:  [36.3 ???C (97.4 ???F)-36.8 ???C (98.2 ???F)]   Pulse:  [74-113]   Respirations:  [18 PER MINUTE-20 PER MINUTE]   SpO2:  [96 %-100 %]    FLACC Total Score: 0 (11/20/18 0804) Vitals:    11/19/18 1000   Weight: 54.4 kg (120 lb)       Intake/Output Summary:  (Last 24 hours)    Intake/Output Summary (Last 24 hours) at 11/21/2018 0700  Last data filed at 11/21/2018 0302  Gross per 24 hour   Intake 520 ml   Output ???   Net 520 ml      Stool Occurrence: 0    Physical Exam  General:  Alert, cooperative, no distress, appears stated age  Lungs:  Clear to auscultation bilaterally  Heart:   Regular rate and rhythm, S1, S2 normal, no murmur, click rub or gallop Abdomen:  Soft, non-tender.  Bowel sounds normal.  No masses.  No organomegaly.  Skin: Skin color, texture, turgor normal.  No rashes or lesions  Neurologic:   CNII - XII intact.  Normal strength, sensation and reflexes throughout.  Psych:   Labile, Delayed and Tangential, Slowed and Hallucinating, Delusional, Impaired and Inappropriate    Lab Review  24-hour labs:    Results for orders placed or  performed during the hospital encounter of 11/19/18 (from the past 24 hour(s))   POC GLUCOSE    Collection Time: 11/20/18  8:00 AM   Result Value Ref Range    Glucose, POC 167 (H) 70 - 100 MG/DL   POC GLUCOSE    Collection Time: 11/20/18 11:45 AM   Result Value Ref Range    Glucose, POC 135 (H) 70 - 100 MG/DL   CBC AND DIFF    Collection Time: 11/20/18 12:26 PM   Result Value Ref Range    White Blood Cells 6.5 4.5 - 11.0 K/UL    RBC 4.89 4.0 - 5.0 M/UL    Hemoglobin 15.0 12.0 - 15.0 GM/DL    Hematocrit 29.5 (H) 36 - 45 %    MCV 93.6 80 - 100 FL    MCH 30.7 26 - 34 PG    MCHC 32.8 32.0 - 36.0 G/DL    RDW 62.1 11 - 15 %    Platelet Count 171 150 - 400 K/UL    MPV 10.7 7 - 11 FL    Neutrophils 55 41 - 77 %    Lymphocytes 31 24 - 44 %    Monocytes 12 4 - 12 %    Eosinophils 1 0 - 5 %    Basophils 1 0 - 2 %    Absolute Neutrophil Count 3.60 1.8 - 7.0 K/UL    Absolute Lymph Count 2.02 1.0 - 4.8 K/UL    Absolute Monocyte Count 0.75 0 - 0.80 K/UL    Absolute Eosinophil Count 0.07 0 - 0.45 K/UL    Absolute Basophil Count 0.06 0 - 0.20 K/UL   TROPONIN-I    Collection Time: 11/20/18 12:26 PM   Result Value Ref Range    Troponin-I 0.02 0.0 - 0.05 NG/ML   BASIC METABOLIC PANEL    Collection Time: 11/20/18  1:50 PM   Result Value Ref Range    Sodium 141 137 - 147 MMOL/L    Potassium 3.3 (L) 3.5 - 5.1 MMOL/L    Chloride 99 98 - 110 MMOL/L    CO2 30 21 - 30 MMOL/L    Anion Gap 12 3 - 12    Glucose 150 (H) 70 - 100 MG/DL    Blood Urea Nitrogen 20 7 - 25 MG/DL    Creatinine 3.08 0.4 - 1.00 MG/DL    Calcium 9.8 8.5 - 65.7 MG/DL eGFR Non African American >60 >60 mL/min    eGFR African American >60 >60 mL/min   POC GLUCOSE    Collection Time: 11/20/18  5:09 PM   Result Value Ref Range    Glucose, POC 183 (H) 70 - 100 MG/DL   POC GLUCOSE    Collection Time: 11/20/18  9:24 PM   Result Value Ref Range    Glucose, POC 211 (H) 70 - 100 MG/DL       Point of Care Testing  (Last 24 hours)  Glucose: (!) 150 (11/20/18 1350)  POC Glucose (Download): (!) 211 (11/20/18 2124)    Radiology and other Diagnostics Review:    Pertinent radiology reviewed.    Bobbye Riggs, MS   Pager

## 2018-11-21 NOTE — Care Plan
Shift: Days    Pain: No pain expressed by patient    Nutrition: Poor; patient did eat half of a peanut butter and jelly. Will drink Boost but needs reminders    GI: Last BM 11/21/2018    GU: Incontinent    Activity: 2:1, Q2 turn    Family: No family at bedside today    Last Shower: 11/21/2018    New Event or Follow Up: Patient heart rate was in the 80s on tele so it was discontinued. Around 1700 patient heart rate went up to the 130s. Doctor was notified and a 558ml bolus of LR and heart rate went down to 94. Patient has had 0.5mg  of ativan twice today for agitation/throwing objects. Patient threw her food and objects in the room around this morning, but has been much better this afternoon/evening. Patient has not been angry at all today, just throwing things out of confusion. Doctors are thinking this is all related to taking husbands psych medications.

## 2018-11-21 NOTE — Progress Notes
I have reviewed the notes, assessment, and/or procedures performed by Haley Mason RN and concur with her/his documentation unless otherwise noted.

## 2018-11-22 ENCOUNTER — Encounter: Admit: 2018-11-22 | Discharge: 2018-11-22

## 2018-11-22 DIAGNOSIS — C8190 Hodgkin lymphoma, unspecified, unspecified site: Secondary | ICD-10-CM

## 2018-11-22 DIAGNOSIS — E119 Type 2 diabetes mellitus without complications: Secondary | ICD-10-CM

## 2018-11-22 DIAGNOSIS — R42 Dizziness and giddiness: Secondary | ICD-10-CM

## 2018-11-22 DIAGNOSIS — M1990 Unspecified osteoarthritis, unspecified site: Secondary | ICD-10-CM

## 2018-11-22 DIAGNOSIS — I4892 Unspecified atrial flutter: Secondary | ICD-10-CM

## 2018-11-22 DIAGNOSIS — I10 Essential (primary) hypertension: Secondary | ICD-10-CM

## 2018-11-22 LAB — PHOSPHORUS: Lab: 3.2 mg/dL (ref 2.0–4.5)

## 2018-11-22 LAB — POC GLUCOSE
Lab: 244 mg/dL — ABNORMAL HIGH (ref 70–100)
Lab: 266 mg/dL — ABNORMAL HIGH (ref 70–100)
Lab: 200 mg/dL — ABNORMAL HIGH (ref 70–100)
Lab: 240 mg/dL — ABNORMAL HIGH (ref 70–100)

## 2018-11-22 LAB — MAGNESIUM: Lab: 1.5 mg/dL — ABNORMAL LOW (ref 1.6–2.6)

## 2018-11-22 LAB — BASIC METABOLIC PANEL: Lab: 140 MMOL/L — ABNORMAL LOW (ref >60)

## 2018-11-22 MED ORDER — MAGNESIUM SULFATE IN D5W 1 GRAM/100 ML IV PGBK
1 g | Freq: Once | INTRAVENOUS | 0 refills | Status: CP
Start: 2018-11-22 — End: ?
  Administered 2018-11-22: 14:00:00 1 g via INTRAVENOUS

## 2018-11-22 MED ORDER — MAGNESIUM SULFATE IN D5W 1 GRAM/100 ML IV PGBK
1 g | Freq: Once | INTRAVENOUS | 0 refills | Status: CP
Start: 2018-11-22 — End: ?
  Administered 2018-11-22: 12:00:00 1 g via INTRAVENOUS

## 2018-11-22 NOTE — Progress Notes
PHYSICAL THERAPY  ASSESSMENT      Name: Julie Dennis        MRN: [id number]          DOB: 03-Dec-1934          Age: 84 y.o.  Admission Date: 11/19/2018             LOS: 3 days      Mobility  Patient Turn/Position: Left  Progressive Mobility Level: Active transfer to chair  Level of Assistance: Assist X2  Assistive Device: Hand Held  Time Tolerated: 11-30 minutes  Activity Limited By: Mental Status Variability;Weakness    Subjective  Significant hospital events: 84 yo F PMH Hodgkins lymphoma, Aflutter not on AC, RBBB, hyperlipidemia, HTN, T2DM admitted after presenting to [facility] clinic appointment with altered mental status evidenced by hallucinations of angels and dogs.    Mental / Cognitive Status: Alert;Cooperative;Confused  Persons Present: Occupational Therapist  Pain: Patient has no complaint of pain  Comments: Patient pleasantly confused; unable to obtain accurate PLOF and home set-up information.     ROM  LE ROM: Left;Right;WFL    Strength  Overall Strength: Generalized Weakness    Posture/Neurological  Posture: Forward Head(Neck flexed to 90 degrees; Posterior trunk lean/retropulsive)    Bed Mobility/Transfer  Bed Mobility: Supine to Sit: Minimal Assist;x2 People;Verbal Cues;Head of Bed Elevated;Requires Extra Time;Safety Considerations;Assist with Trunk  Bed Mobility: Sit to Supine: Minimal Assist;of 1st person;Standby Assist;of 2nd person;Bed Flat;Use of Rail;Requires Extra Time;Safety Considerations;Assist with Trunk  Transfer Type: Sit to/from Stand  Transfer: Assistance Level: To/From;Bed;Commode;Minimal Assist;x2 People  Transfer: Assistive Device: Doctor, general practice  Transfers: Type Of Assistance: Verbal Cues;Knees(s) Blocked;For Balance;For Strength Deficit;For Safety Considerations  Other Transfer Type: Stand Pivot  Other Transfer: Assistance Level: From;Bed;To/From;Commode;Moderate Assist;x2 People  Other Transfer: Assistive Device: Doctor, general practice Other Transfer: Type Of Assistance: Verbal Cues;For Safety Considerations;For Strength Deficit;For Balance;Knees(s) Blocked;Requires Extra Time  End Of Activity Status: In Bed;Nursing Notified;Instructed Patient to Request Assist with Mobility;Instructed Patient to Use Call Light(Bed alarm on; All needs in reach)    Balance  Sitting Balance: Static Sitting Balance;2 UE Support;Standby Assist;x2 People  Standing Balance: Static Standing Balance;Dynamic Standing Balance;2 UE support;Moderate Assist;x2 People    Gait  Comments: Unsafe to attempt due to mental status variability and generalized weakness.     Education  Persons Educated: Patient  Patient Barriers To Learning: Confusion  Interventions: Repetition of Instructions;Demonstration Provided  Teaching Methods: Verbal Instruction  Patient Response: More Instruction Required;Return Demonstration  Topics: Plan/Goals of PT Interventions;Mobility Progression;Safety Awareness;Up with Assist Only;Importance of Increasing Activity;Recommend Continued Therapy    Assessment/Progress  Impaired Mobility Due To: Medical Status Limitation;Safety Concerns;Impaired Balance;Decreased Strength  Assessment/Progress: Should Improve w/ Continued PT  Comments: Patient presents with altered mental status evidenced by hallucinations of angels and dogs. On examination, patient pleasantly confused (singing throughout session) but able to tolerate stand pivot to/from bedside commode with assist x2. Patient retropulsive and with difficulty stepping due to poor foot clearance B. Patient will benefit from ongoing physical therapy intervention in order to progress functional mobility level and increase safety prior to discharge. Anticipate patient will improve quickly with improved mental status.     Goals  Goal Formulation: Patient Unable to Participate in Goal Setting  Time For Goal Achievement: 5 days  Patient Will Go Supine To/From Sit: w/ Stand By Assist Patient Will Transfer Bed/Chair: w/ Stand By Assist  Patient Will Ambulate: 51-100 Feet, w/ Dan Humphreys, w/ Minimal Assist    Plan  Treatment Interventions: Mobility Training;Strengthening;Balance Activities;Endurance Training  Plan Frequency: 5 Days per Week  PT Plan for Next Visit: Progress transfers and safety, gait as appropriate, sitting balance EOB    PT Discharge Recommendations  Recommendation: Inpatient setting  Patient Currently Requires Physical Assist With: All mobility  Patient Currently Requires Equipment: (Will update as appropriate)    Therapist  Sunday Corn, PT, DPT [phone]  Date  11/22/2018

## 2018-11-22 NOTE — Progress Notes
OCCUPATIONAL THERAPY  ASSESSMENT NOTE      Name: Julie Dennis        MRN: [id number]          DOB: 1934-07-01          Age: 84 y.o.  Admission Date: 11/19/2018             LOS: 3 days    Mobility  Patient Turn/Position: Left  Progressive Mobility Level: Active transfer to chair  Level of Assistance: Assist X2  Assistive Device: Hand Held  Time Tolerated: 11-30 minutes  Activity Limited By: Mental Status Variability;Weakness    Subjective  Pertinent Dx per Physician: 84 yo F PMH Hodgkins lymphoma, Aflutter not on AC, RBBB, hyperlipidemia, HTN, T2DM admitted after presenting to [facility] clinic appointment with altered mental status evidenced by hallucinations of angels and dogs.  Precautions: Standard;Falls  Pain / Complaints: Patient agrees to participate in therapy  Comments: Pt is pleasantly confused. Pt sings and laughs to herself in between questions and is easily redirectable.    Objective  Psychosocial Status: Participates in Therapy with Encouragement  Persons Present: Physical Therapist    Home Living  Comment: Pt unable to provide PLOF. She reports she lives alone in a house with lots of stairs. Will follow up when family is available.    Vision  Current Vision: Does Not Wear Glasses  Comment: Pt denies vision deficits however is hallucinating and seeing angels and dogs.    ADL's  Where Assessed: Edge of Bed;Chair  LE Dressing Assist: Total Assist  LE Dressing Deficits: Don/Doff R Sock;Don/Doff L Sock  Toileting Assist: Maximum Assist  Toileting Deficits: Bedside Commode;Clothing Management Up;Clothing Management Down;Perineal Hygiene  Comment: Pt incontinent of urine. Required assist x2 to transfer to bedside commode. Toileting required assist x2.    ADL Mobility  Bed Mobility: Supine to Sit: Maximum assist  Bed Mobility: Sit to Supine: Moderate assist  Transfer Type: Sit to/from stand  Transfer: Assistance Level: From;Bed;Minimal assist;x2 people  Transfer: Assistive Device: Hand hold assist Transfer: Type of Assistance: For balance;For safety considerations  Other Transfer Type: Stand pivot  Other Transfer: Assistance Level: From;Bed;To;Moderate assist;x2 people;Commode  Other Transfer: Assistive Device: Hand hold assist  Other Transfer: Type of Assistance: For balance;For safety considerations;Knee(s) blocked  End of Activity Status: In bed  Sitting Balance: Minimal assist  Standing Balance: Moderate assist;x2 people  Gait Comments: Unsafe to attempt due to mental status variability and generalized weakness.     Activity Tolerance  Endurance: 2/5 Tolerates 10-20 Minutes Exercise w/Multiple Rests    Cognition  Overall Cognitive Status: Impaired  Expression: Increased Time for Expression;Distractable  Social Interaction: Encouragement/Coaxing to Participate;Cueing for Appropriateness;Increased Time to Adjust  Problem Solving: Cueing to Sequence Task  Memory: Unable to Provide Accurate Prior Level of Functioning History  Orientation: Alert & Oriented x1  Cognition Comment: Thoughts appeared tangential but patient is overall pleasant. hallucinating. Unable to provide accurate PLOF.    UE AROM  Overall BUE AROM WNL: Yes  Grasp: Bilateral Grasp Functional for Activity  Comment: WFL during functional exam.    UE Strength / Tone  Strength/Tone Not Assessed: Due to Cognitive Deficits    Education  Persons Educated: Patient  Barriers To Learning: Cognitive Deficits  Interventions: Repetition of Instructions  Teaching Methods: Verbal Instruction  Patient Response: More Instruction Required  Topics: Role of OT, Goals for Therapy  Goal Formulation: Patient Unable to Participate in Goal Setting    Assessment  Assessment: Decreased ADL Status;Decreased  UE Strength;Decreased Cognition;Decreased Endurance;Decreased Self-Care Trans;Decreased High-Level ADLs  Goal Formulation: Patient  Comments: Anticipate pt to progress once cognition improves.    AM-PAC 6 Clicks Daily Activity Inpatient Putting on and taking off regular lower body clothes?: A Lot  Bathing (Including washing, rinsing, drying): A Lot  Toileting, which includes using toilet, bedpan, or urinal: A Lot  Putting on and taking off regular upper body clothing: A Lot  Taking care of personal grooming such as brushing teeth: A Lot  Eating meals?: A Lot  Daily Activity Raw Score: 12  Standardized (t-scale) score: 30.6  CMS 0-100% Score: 66.57  CMS G Code Modifier: CL    Plan  OT Frequency: 5x/week  OT Plan for Next Visit: Longer distance ambulaiton, ADLs in the bathroom as cognition allows.    ADL Goals  Patient Will Perform All ADL's: w/ Stand By Assist  Patient Will Perform LE Dressing: w/ Stand By Assist  Patient Will Perform Toileting: w/ Stand By Assist    Functional Transfer Goals  Pt Will Perform All Functional Transfers: w/ Stand By Assist    OT Discharge Recommendations  Recommendation: Inpatient setting      Therapist: Roxy Horseman, OTR/L [id number]  Date: 11/22/2018

## 2018-11-23 LAB — POC GLUCOSE
Lab: 158 mg/dL — ABNORMAL HIGH (ref 70–100)
Lab: 264 mg/dL — ABNORMAL HIGH (ref 70–100)
Lab: 214 mg/dL — ABNORMAL HIGH (ref 70–100)
Lab: 167 mg/dL — ABNORMAL HIGH (ref 70–100)

## 2018-11-23 LAB — BASIC METABOLIC PANEL
Lab: 0.6 mg/dL (ref 0.4–1.00)
Lab: 13 mg/dL (ref 7–25)
Lab: 140 MMOL/L (ref 137–147)
Lab: 29 MMOL/L (ref 21–30)
Lab: 293 mg/dL — ABNORMAL HIGH (ref 70–100)
Lab: 3.9 MMOL/L (ref 3.5–5.1)
Lab: 8.8 mg/dL (ref 8.5–10.6)
Lab: >60 mL/min (ref >60)
Lab: >60 mL/min (ref >60)

## 2018-11-23 LAB — MAGNESIUM: Lab: 1.8 mg/dL (ref 1.6–2.6)

## 2018-11-23 LAB — PHOSPHORUS: Lab: 2.9 mg/dL (ref 2.0–4.5)

## 2018-11-23 NOTE — Case Management (ED)
Case Management Progress Note    NAME:Julie Dennis                          MRN: [id number]              DOB:Jul 06, 1934          AGE: 84 y.o.  ADMISSION DATE: 11/19/2018             DAYS ADMITTED: LOS: 4 days      Todays Date: 11/23/2018    Plan  Pt will discharge when medically stable.     Interventions  ? Support      ? Info or Referral      ? Discharge Planning       SW received a page from bedside RN requesting reading glasses for pt. SW tasked CMA to deliver.     ? Medication Needs                                                 ? Financial      ? Legal      ? Other        Disposition  ? Expected Discharge Date    Expected Discharge Date: 11/24/18  ? Transportation   Does the patient need discharge transport arranged?: No  Transportation Name, Phone and Availability #1: son Truman Hayward or his wife will provide transport home [phone]  Does the patient use Medicaid Transportation?: No  ? Next Level of Care (Acute Psych discharges only)      ? Discharge Disposition                                          Durable Medical Equipment      No service has been selected for the patient.      [place] Dest[facility]      No service has been selected for the patient.      [place]      No service has been selected for the patient.      [facility]      No service has been selected for the patient.        Lynnea Maizes, Lithium

## 2018-11-23 NOTE — Care Plan
Problem: Skin Integrity  Goal: Skin integrity intact  Outcome: Goal Ongoing  Goal: Healing of skin (Wound & Incision)  Outcome: Goal Ongoing  Goal: Healing of skin (Pressure Injury)  Outcome: Goal Ongoing     Problem: Falls, High Risk of  Goal: Absence of falls-Adult Patient  Outcome: Goal Ongoing  Goal: Absence of Falls-Pediatric patient  Outcome: Goal Ongoing     Problem: Respiratory Impairment (Non-Ventilated Patient)  Goal: Effective gas exchange  Outcome: Goal Ongoing     Problem: Neurological Status, Impaired/Altered  Goal: Progress toward maximizing functional outcomes  Outcome: Goal Ongoing  Goal: Cognitive status restored to baseline  Outcome: Goal Ongoing     Problem: Nutrition Deficit  Goal: Adequate nutritional intake  Outcome: Goal Ongoing  Goal: Body weight within specified parameters  Outcome: Goal Ongoing     Problem: Self-Care Deficit  Goal: Maximize ADL functioning  Outcome: Goal Ongoing

## 2018-11-23 NOTE — Progress Notes
Spoke to daughter-in-law Juluis Rainier for progress update. No new findings. Mental status same.

## 2018-11-24 LAB — POC GLUCOSE
Lab: 216 mg/dL — ABNORMAL HIGH (ref 70–100)
Lab: 147 mg/dL — ABNORMAL HIGH (ref 70–100)
Lab: 248 mg/dL — ABNORMAL HIGH (ref 70–100)
Lab: 326 mg/dL — ABNORMAL HIGH (ref 70–100)

## 2018-11-24 LAB — MAGNESIUM: Lab: 1.7 mg/dL (ref >60)

## 2018-11-24 LAB — PHOSPHORUS: Lab: 3.2 mg/dL — ABNORMAL HIGH (ref >60)

## 2018-11-24 LAB — BASIC METABOLIC PANEL
Lab: 140 MMOL/L — ABNORMAL LOW (ref 137–147)
Lab: 6 g/dL — ABNORMAL LOW (ref 3–12)

## 2018-11-24 MED ORDER — AMLODIPINE 5 MG PO TAB
5 mg | Freq: Every day | ORAL | 0 refills | Status: DC
Start: 2018-11-24 — End: 2018-11-27
  Administered 2018-11-24 – 2018-11-27 (×4): 5 mg via ORAL

## 2018-11-24 NOTE — Progress Notes
ATTESTATION    I personally performed or re-performed the history, physical exam and treatment for the E/M. I discussed the case with the Medical Student, and concur with the Medical Student documentation of history, physical exam and treatment plan unless otherwise noted.       Remains disoriented  Not answering questions specifically  Will d/w Neurology in am regarding need for further work up  Norvasc started for elevated BP      Staff name:  Gladis Riffle, MD Date:  11/24/2018                       General Progress Note  ???  Name:  Julie Dennis   [id number] Date:  11/23/2018  Admission Date: 11/19/2018                       Assessment/Plan:    Principal Problem:    Altered behavior  ???  84 yo F PMH Hodgkins lymphoma, Aflutter not on AC, RBBB, hyperlipidemia, HTN, T2DM admitted after presenting to [facility] clinic appointment with altered mental status evidenced by hallucinations of angels and dogs.  ???  Subacute encephalopathy  -now stable  -delirium vs infection vs psychotic disorder vs underlying dementia. Concern if patient ingested her spouse's psychiatric medications so provoked brief psychotic episode secondary to medications  -after talking to son, there is likely some progressive dementia, acutely exacerbated by another cause  -CBC, CMP, TSH, UA, UDS largely unremarkable. Blood cultures negative so far. CXR/head CT negative for acute changes; CT demonstrates likely small-vessel ischemic disease  --MRI Brain w/wo contrast showed no acute changes, sequelae of chronic vessel disease  -infection less likely given workup  -Psych Consult rec:  --d/c Olanzapine use given QTc 522. Ativan prn for agitation   --delirium protocol; frequently reorient, encourage day/night cycle, engage family  --agree with further workup  -No B12 deficiency  -Normal ESR, CRP   Plan  -consulted neuro for possible lymphoma reactivation work-up.  -given lack of complete return to baseline w/ possible incorrect medication washout, LP w/ flow cytometry may be necessary  -track mental status    ???  Hyperlipidemia  -rosuvastatin 10  ???  Type 2 Diabetes  -sliding scale while in the hospital  ???  Hypertension  -on Atenolol 25 BID  -hypertensive, start norvasc 5mg  on 8/9  ???  Right Bundle Branch Block  -chronic, no concern for ACS  ???  At risk for DVT  -lower extremity doppler US 8/5 no DVT at that time  -enoxaparin ppx  ________________________________________________________________________  ???  Subjective  Julie Dennis is a 84 y.o. female. Patient appears chronological age, but is disheveled and kyphotic, fairly immobile in bed.   Tangential and lacks insight  Oriented x2  Denied pain, N/V  Reported feeling well.        Medications  Scheduled Meds:aspirin EC tablet 81 mg, 81 mg, Oral, BID  atenoloL (TENORMIN) tablet 25 mg, 25 mg, Oral, BID  cholecalciferol (VITAMIN D-3) tablet 1,000 Units, 1,000 Units, Oral, QDAY  enoxaparin (LOVENOX) syringe 40 mg, 40 mg, Subcutaneous, QDAY(21)  insulin aspart U-100 (NOVOLOG FLEXPEN) injection PEN 0-6 Units, 0-6 Units, Subcutaneous, ACHS (22)  latanoprost (XALATAN) 0.005 % ophthalmic solution 1 drop, 1 drop, Both Eyes, QHS  melatonin tablet 3 mg, 3 mg, Oral, QHS  rosuvastatin (CRESTOR) tablet 10 mg, 10 mg, Oral, QDAY  senna/docusate (SENOKOT-S) tablet 2 tablet, 2 tablet, Oral, BID  thiamine (VITAMIN B-1) tablet 100 mg,  100 mg, Oral, QDAY  ???  Continuous Infusions:  ??? lactated ringers infusion 50 mL/hr at 11/20/18 1700   ???  PRN and Respiratory Meds:acetaminophen Q6H PRN, docusate QDAY PRN, LORazepam Q6H PRN **OR** LORazepam  (ATIVAN)  injection Q6H PRN, melatonin QHS PRN  ???  ???  Review of Systems:  Review of systems not obtained from patient due to patient factors.    ???  Objective:   ???                       Vital Signs: Last Filed                 Vital Signs: 24 Hour Range   BP: 141/88 (08/06 0301)  Temp: 36.8 ???C (98.2 ???F) (08/06 0301)  Pulse: 74 (08/06 0407) Respirations: 20 PER MINUTE (08/06 0301)  SpO2: 100 % (08/06 0301) BP: (129-162)/(58-96)   Temp:  [36.3 ???C (97.4 ???F)-36.8 ???C (98.2 ???F)]   Pulse:  [74-113]   Respirations:  [18 PER MINUTE-20 PER MINUTE]   SpO2:  [96 %-100 %]    FLACC Total Score: 0 (11/20/18 0804)     Vitals:   ??? 11/19/18 1000   Weight: 54.4 kg (120 lb)       Intake/Output Summary:  (Last 24 hours)  ???  Intake/Output Summary (Last 24 hours) at 11/21/2018 0700  Last data filed at 11/21/2018 0302      Gross per 24 hour   Intake 520 ml   Output ???   Net 520 ml      Stool Occurrence: 0  ???  Physical Exam  General:  Alert, cooperative, no distress, appears stated age  Lungs:  Clear to auscultation bilaterally  Heart:   Regular rate and rhythm, S1, S2 normal, no murmur  Abdomen:  Soft, non-tender.  Bowel sounds normal.   Neurologic: no focal deficits grossly   Psych:   Labile, Delayed and Tangential, Slowed and Hallucinating, Delusional, Impaired and Inappropriate  ???  Lab Review  24-hour labs:          Results for orders placed or performed during the hospital encounter of 11/19/18 (from the past 24 hour(s))   POC GLUCOSE   ??? Collection Time: 11/20/18  8:00 AM   Result Value Ref Range   ??? Glucose, POC 167 (H) 70 - 100 MG/DL   POC GLUCOSE   ??? Collection Time: 11/20/18 11:45 AM   Result Value Ref Range   ??? Glucose, POC 135 (H) 70 - 100 MG/DL   CBC AND DIFF   ??? Collection Time: 11/20/18 12:26 PM   Result Value Ref Range   ??? White Blood Cells 6.5 4.5 - 11.0 K/UL   ??? RBC 4.89 4.0 - 5.0 M/UL   ??? Hemoglobin 15.0 12.0 - 15.0 GM/DL   ??? Hematocrit 45.7 (H) 36 - 45 %   ??? MCV 93.6 80 - 100 FL   ??? MCH 30.7 26 - 34 PG   ??? MCHC 32.8 32.0 - 36.0 G/DL   ??? RDW 13.8 11 - 15 %   ??? Platelet Count 171 150 - 400 K/UL   ??? MPV 10.7 7 - 11 FL   ??? Neutrophils 55 41 - 77 %   ??? Lymphocytes 31 24 - 44 %   ??? Monocytes 12 4 - 12 %   ??? Eosinophils 1 0 - 5 %   ??? Basophils 1 0 - 2 %   ??? Absolute Neutrophil Count 3.60 1.8 -  7.0 K/UL   ??? Absolute Lymph Count 2.02 1.0 - 4.8 K/UL ??? Absolute Monocyte Count 0.75 0 - 0.80 K/UL   ??? Absolute Eosinophil Count 0.07 0 - 0.45 K/UL   ??? Absolute Basophil Count 0.06 0 - 0.20 K/UL   TROPONIN-I   ??? Collection Time: 11/20/18 12:26 PM   Result Value Ref Range   ??? Troponin-I 0.02 0.0 - 0.05 Dennis/ML   BASIC METABOLIC PANEL   ??? Collection Time: 11/20/18  1:50 PM   Result Value Ref Range   ??? Sodium 141 137 - 147 MMOL/L   ??? Potassium 3.3 (L) 3.5 - 5.1 MMOL/L   ??? Chloride 99 98 - 110 MMOL/L   ??? CO2 30 21 - 30 MMOL/L   ??? Anion Gap 12 3 - 12   ??? Glucose 150 (H) 70 - 100 MG/DL   ??? Blood Urea Nitrogen 20 7 - 25 MG/DL   ??? Creatinine 0.77 0.4 - 1.00 MG/DL   ??? Calcium 9.8 8.5 - 10.6 MG/DL   ??? eGFR Non African American >60 >60 mL/min   ??? eGFR African American >60 >60 mL/min   POC GLUCOSE   ??? Collection Time: 11/20/18  5:09 PM   Result Value Ref Range   ??? Glucose, POC 183 (H) 70 - 100 MG/DL   POC GLUCOSE   ??? Collection Time: 11/20/18  9:24 PM   Result Value Ref Range   ??? Glucose, POC 211 (H) 70 - 100 MG/DL   ???  ???  Point of Care Testing  (Last 24 hours)  Glucose: (!) 150 (11/20/18 1350)  POC Glucose (Download): (!) 211 (11/20/18 2124)  ???  Radiology and other Diagnostics Review:    Pertinent radiology reviewed.  ???  Gladis Riffle, MD

## 2018-11-24 NOTE — Care Plan
Problem: Skin Integrity  Goal: Skin integrity intact  Outcome: Goal Ongoing  Goal: Healing of skin (Wound & Incision)  Outcome: Goal Ongoing  Goal: Healing of skin (Pressure Injury)  Outcome: Goal Ongoing     Problem: Falls, High Risk of  Goal: Absence of falls-Adult Patient  Outcome: Goal Ongoing  Goal: Absence of Falls-Pediatric patient  Outcome: Goal Ongoing     Problem: Respiratory Impairment (Non-Ventilated Patient)  Goal: Effective gas exchange  Outcome: Goal Ongoing     Problem: Neurological Status, Impaired/Altered  Goal: Progress toward maximizing functional outcomes  Outcome: Goal Ongoing  Goal: Cognitive status restored to baseline  Outcome: Goal Ongoing     Problem: Nutrition Deficit  Goal: Adequate nutritional intake  Outcome: Goal Ongoing  Goal: Body weight within specified parameters  Outcome: Goal Ongoing     Problem: Self-Care Deficit  Goal: Maximize ADL functioning  Outcome: Goal Ongoing

## 2018-11-25 LAB — PHOSPHORUS: Lab: 3.8 mg/dL — ABNORMAL HIGH (ref 2.0–4.5)

## 2018-11-25 LAB — CULTURE-BLOOD W/SENSITIVITY

## 2018-11-25 LAB — POC GLUCOSE
Lab: 196 mg/dL — ABNORMAL HIGH (ref 70–100)
Lab: 185 mg/dL — ABNORMAL HIGH (ref 70–100)
Lab: 245 mg/dL — ABNORMAL HIGH (ref 70–100)
Lab: 270 mg/dL — ABNORMAL HIGH (ref 70–100)

## 2018-11-25 LAB — MAGNESIUM: Lab: 1.7 mg/dL — ABNORMAL LOW (ref >60)

## 2018-11-25 LAB — BASIC METABOLIC PANEL: Lab: 139 MMOL/L — ABNORMAL LOW (ref 137–147)

## 2018-11-25 NOTE — Procedures
EPILEPSY REPORT      Julie Dennis  1934-12-22  [id number]    Date of study: 11/25/18    MEDICATIONS:  Scheduled Meds:amLODIPine (NORVASC) tablet 5 mg, 5 mg, Oral, QDAY  aspirin EC tablet 81 mg, 81 mg, Oral, BID  atenoloL (TENORMIN) tablet 25 mg, 25 mg, Oral, BID  cholecalciferol (VITAMIN D-3) tablet 1,000 Units, 1,000 Units, Oral, QDAY  enoxaparin (LOVENOX) syringe 40 mg, 40 mg, Subcutaneous, QDAY(21)  insulin aspart U-100 (NOVOLOG FLEXPEN) injection PEN 0-6 Units, 0-6 Units, Subcutaneous, ACHS (22)  latanoprost (XALATAN) 0.005 % ophthalmic solution 1 drop, 1 drop, Both Eyes, QHS  melatonin tablet 3 mg, 3 mg, Oral, QHS  rosuvastatin (CRESTOR) tablet 10 mg, 10 mg, Oral, QDAY  senna/docusate (SENOKOT-S) tablet 2 tablet, 2 tablet, Oral, BID  thiamine (VITAMIN B-1) tablet 100 mg, 100 mg, Oral, QDAY    Continuous Infusions:  PRN and Respiratory Meds:acetaminophen Q6H PRN, docusate QDAY PRN, LORazepam Q6H PRN **OR** LORazepam  (ATIVAN)  injection Q6H PRN, melatonin QHS PRN      TECHNIQUE:  This  routine EEG monitoring was performed on a 32-channel digital recording device.  The 10-20 [employer] Electrode Placement System was used with addition of T1 and T2 electrodes.       This routine EEG is requested for this 84 year old woman with a question of seizure as a cause of her altered mental status and confusion.    During quite wakefulness there was 10 Hz, reactive alpha rhythm.   During drowsiness there was waxing and waning of the alpha rhythm and emergence of diffusely distributed theta activity. Stage II sleep was marked by vertex sharp waves,and sleep spindles.There was no further sleep stage noted.  Intermittent photic stimulation did not alter the tracing.  Hyperventilation was not performed as per standard laboratory protocol due to ongoing Covid-19 outbreak.      IMPRESSION:  This routine EEG of this 84 year old woman is normal for age and state.      Maia Petties, MD  Associate Professor [facility]  Department of Neurology

## 2018-11-25 NOTE — Progress Notes
Tech performed routine EEG in patient room without complications.

## 2018-11-25 NOTE — Progress Notes
ATTESTATION    I personally performed or re-performed the history, physical exam and treatment for the E/M. I discussed the case with the Medical Student, and concur with the Medical Student documentation of history, physical exam and treatment plan unless otherwise noted.     She denies any pain, dyspnea, N/V  Discussed with Neurology, will obtain EEG today. Neurology will see her.  Remains disoriented, confused, tangential.   Will need to discuss with family as well to clarify discharge plans   Continue current BP meds    Staff name:  Gladis Riffle, MD Date:  11/25/2018                    General Progress Note  ???  Name:  Julie Dennis   [id number] Date:  11/23/2018  Admission Date: 11/19/2018                       Assessment/Plan:    Principal Problem:    Altered behavior  ???  84 yo F PMH Hodgkins lymphoma, Aflutter not on AC, RBBB, hyperlipidemia, HTN, T2DM admitted after presenting to [facility] clinic appointment with altered mental status evidenced by hallucinations of angels and dogs.  ???  Subacute encephalopathy  -now stable  -delirium vs infection vs psychotic disorder vs underlying dementia.   -after talking to son, there is likely some progressive dementia, acutely exacerbated by another cause  --MRI Brain w/wo contrast showed no acute changes, sequelae of chronic vessel disease  -infection less likely given workup  Plan  -consulted neuro for possible CNS lymphoma  -awake/asleep EEG ordered    ???  Hyperlipidemia  -rosuvastatin 10  ???  Type 2 Diabetes  -sliding scale while in the hospital  ???  Hypertension  -on Atenolol 25 BID  -hypertensive, start norvasc 5mg  on 8/9  -blood pressure labile, monitor  ???  Right Bundle Branch Block  -chronic, no concern for ACS  ???  At risk for DVT  -elevated D-dimer of ~1900 on time of admission  -lower extremity doppler US 8/5 no DVT at that time  -no chest pain, tachypnea, hypoxia, cough -- low clinical suspicion for PE  -enoxaparin ppx ________________________________________________________________________  ???  Subjective  Julie Dennis is a 84 y.o. female. Patient appears chronological age, but is disheveled and kyphotic, fairly immobile in bed.   Tangential and lacks insight  Oriented x2  Denied pain, N/V  Reported feeling well.        Medications  Scheduled Meds:aspirin EC tablet 81 mg, 81 mg, Oral, BID  atenoloL (TENORMIN) tablet 25 mg, 25 mg, Oral, BID  cholecalciferol (VITAMIN D-3) tablet 1,000 Units, 1,000 Units, Oral, QDAY  enoxaparin (LOVENOX) syringe 40 mg, 40 mg, Subcutaneous, QDAY(21)  insulin aspart U-100 (NOVOLOG FLEXPEN) injection PEN 0-6 Units, 0-6 Units, Subcutaneous, ACHS (22)  latanoprost (XALATAN) 0.005 % ophthalmic solution 1 drop, 1 drop, Both Eyes, QHS  melatonin tablet 3 mg, 3 mg, Oral, QHS  rosuvastatin (CRESTOR) tablet 10 mg, 10 mg, Oral, QDAY  senna/docusate (SENOKOT-S) tablet 2 tablet, 2 tablet, Oral, BID  thiamine (VITAMIN B-1) tablet 100 mg, 100 mg, Oral, QDAY  ???  Continuous Infusions:  ??? lactated ringers infusion 50 mL/hr at 11/20/18 1700   ???  PRN and Respiratory Meds:acetaminophen Q6H PRN, docusate QDAY PRN, LORazepam Q6H PRN **OR** LORazepam  (ATIVAN)  injection Q6H PRN, melatonin QHS PRN  ???  ???  Review of Systems:  Review of systems not obtained from patient due to  patient factors.    ???  Objective:   ???                       Vital Signs: Last Filed                 Vital Signs: 24 Hour Range   BP: 141/88 (08/06 0301)  Temp: 36.8 ???C (98.2 ???F) (08/06 0301)  Pulse: 74 (08/06 0407)  Respirations: 20 PER MINUTE (08/06 0301)  SpO2: 100 % (08/06 0301) BP: (129-162)/(58-96)   Temp:  [36.3 ???C (97.4 ???F)-36.8 ???C (98.2 ???F)]   Pulse:  [74-113]   Respirations:  [18 PER MINUTE-20 PER MINUTE]   SpO2:  [96 %-100 %]    FLACC Total Score: 0 (11/20/18 0804)     Vitals:   ??? 11/19/18 1000   Weight: 54.4 kg (120 lb)       Intake/Output Summary:  (Last 24 hours)  ???  Intake/Output Summary (Last 24 hours) at 11/21/2018 0700 Last data filed at 11/21/2018 0302      Gross per 24 hour   Intake 520 ml   Output ???   Net 520 ml      Stool Occurrence: 0  ???  Physical Exam  General:  Alert, cooperative, no distress, appears stated age  Lungs:  Clear to auscultation bilaterally  Heart:   Regular rate and rhythm, S1, S2 normal, no murmur  Abdomen:  Soft, non-tender.  Bowel sounds normal.   Neurologic: no focal deficits grossly   Psych:   Labile, Delayed and Tangential, Slowed and Hallucinating, Delusional, Impaired and Inappropriate  ???  Lab Review  24-hour labs:          Results for orders placed or performed during the hospital encounter of 11/19/18 (from the past 24 hour(s))   POC GLUCOSE   ??? Collection Time: 11/20/18  8:00 AM   Result Value Ref Range   ??? Glucose, POC 167 (H) 70 - 100 MG/DL   POC GLUCOSE   ??? Collection Time: 11/20/18 11:45 AM   Result Value Ref Range   ??? Glucose, POC 135 (H) 70 - 100 MG/DL   CBC AND DIFF   ??? Collection Time: 11/20/18 12:26 PM   Result Value Ref Range   ??? White Blood Cells 6.5 4.5 - 11.0 K/UL   ??? RBC 4.89 4.0 - 5.0 M/UL   ??? Hemoglobin 15.0 12.0 - 15.0 GM/DL   ??? Hematocrit 45.7 (H) 36 - 45 %   ??? MCV 93.6 80 - 100 FL   ??? MCH 30.7 26 - 34 PG   ??? MCHC 32.8 32.0 - 36.0 G/DL   ??? RDW 13.8 11 - 15 %   ??? Platelet Count 171 150 - 400 K/UL   ??? MPV 10.7 7 - 11 FL   ??? Neutrophils 55 41 - 77 %   ??? Lymphocytes 31 24 - 44 %   ??? Monocytes 12 4 - 12 %   ??? Eosinophils 1 0 - 5 %   ??? Basophils 1 0 - 2 %   ??? Absolute Neutrophil Count 3.60 1.8 - 7.0 K/UL   ??? Absolute Lymph Count 2.02 1.0 - 4.8 K/UL   ??? Absolute Monocyte Count 0.75 0 - 0.80 K/UL   ??? Absolute Eosinophil Count 0.07 0 - 0.45 K/UL   ??? Absolute Basophil Count 0.06 0 - 0.20 K/UL   TROPONIN-I   ??? Collection Time: 11/20/18 12:26 PM   Result Value Ref Range   ???  Troponin-I 0.02 0.0 - 0.05 NG/ML   BASIC METABOLIC PANEL   ??? Collection Time: 11/20/18  1:50 PM   Result Value Ref Range   ??? Sodium 141 137 - 147 MMOL/L   ??? Potassium 3.3 (L) 3.5 - 5.1 MMOL/L ??? Chloride 99 98 - 110 MMOL/L   ??? CO2 30 21 - 30 MMOL/L   ??? Anion Gap 12 3 - 12   ??? Glucose 150 (H) 70 - 100 MG/DL   ??? Blood Urea Nitrogen 20 7 - 25 MG/DL   ??? Creatinine 0.77 0.4 - 1.00 MG/DL   ??? Calcium 9.8 8.5 - 10.6 MG/DL   ??? eGFR Non African American >60 >60 mL/min   ??? eGFR African American >60 >60 mL/min   POC GLUCOSE   ??? Collection Time: 11/20/18  5:09 PM   Result Value Ref Range   ??? Glucose, POC 183 (H) 70 - 100 MG/DL   POC GLUCOSE   ??? Collection Time: 11/20/18  9:24 PM   Result Value Ref Range   ??? Glucose, POC 211 (H) 70 - 100 MG/DL   ???  ???  Point of Care Testing  (Last 24 hours)  Glucose: (!) 150 (11/20/18 1350)  POC Glucose (Download): (!) 211 (11/20/18 2124)  ???  Radiology and other Diagnostics Review:    Pertinent radiology reviewed.  ???  Gladis Riffle, MD

## 2018-11-25 NOTE — Progress Notes
OCCUPATIONAL THERAPY  NOTE       Name: Julie Dennis        MRN: [id number]          DOB: [birth year]          Age: 84 y.o.  Admission Date: 11/19/2018             LOS: 6 days    Patient not appropriate for therapy intervention. Unable to follow purposeful commands due to current mental status. EEG at bedside. PT/OT will continue to follow and provide intervention as appropriate.       Therapist: Clearence Cheek, OTR/L [phone]  Date: 11/25/2018

## 2018-11-26 LAB — PHOSPHORUS: Lab: 3.8 mg/dL — ABNORMAL LOW (ref 2.0–4.5)

## 2018-11-26 LAB — POC GLUCOSE
Lab: 280 mg/dL — ABNORMAL HIGH (ref 70–100)
Lab: 212 mg/dL — ABNORMAL HIGH (ref 70–100)
Lab: 161 mg/dL — ABNORMAL HIGH (ref 70–100)
Lab: 290 mg/dL — ABNORMAL HIGH (ref 70–100)

## 2018-11-26 LAB — BASIC METABOLIC PANEL: Lab: 139 MMOL/L (ref 137–147)

## 2018-11-26 LAB — MAGNESIUM: Lab: 1.6 mg/dL — ABNORMAL HIGH (ref 1.6–2.6)

## 2018-11-26 MED ORDER — MAGNESIUM SULFATE IN D5W 1 GRAM/100 ML IV PGBK
1 g | Freq: Once | INTRAVENOUS | 0 refills | Status: CP
Start: 2018-11-26 — End: ?
  Administered 2018-11-26: 21:00:00 1 g via INTRAVENOUS

## 2018-11-26 NOTE — Progress Notes
I have reviewed the notes, assessment, and/or procedures performed by H. Settles, Therapist, sports and concur with herdocumentation unless otherwise noted.

## 2018-11-26 NOTE — Progress Notes
ATTESTATION    I personally performed or re-performed the history, physical exam and treatment for the E/M. I discussed the case with the Medical Student, and concur with the Medical Student documentation of history, physical exam and treatment plan unless otherwise noted.     She denies any pain, dyspnea, N/V  Discussed with Neurology, obtained EEG on 8/10-> normal.   Seen by neurology again on 8/10  Remains disoriented, confused, tangential.   Called family and discussed discharge plans, plan for discharge home, family does not want her going to a facility, PT recommended inpatient, family stated they will hire help  Son, Nedra Hai said they have been taking care of their dad at home and will be able to provide care to her as well, plan for discharge home in a.m.  Continue current BP meds  Mag replaced  Dispo: Continue inpatient, discharge home in the morning with family      Staff name:  Gladis Riffle, MD Date:  11/26/2018                   General Progress Note  ???  Name:  Julie Dennis   IONGE'X Date:  11/23/2018  Admission Date: 11/19/2018                       Assessment/Plan:    Principal Problem:    Altered behavior  ???  84 yo F PMH Hodgkins lymphoma, Aflutter not on AC, RBBB, hyperlipidemia, HTN, T2DM admitted after presenting to [facility] clinic appointment with altered mental status evidenced by hallucinations of angels and dogs.  ???  Subacute encephalopathy  -now stable  -delirium vs infection vs psychotic disorder vs underlying dementia.   -after talking to son, there is likely some progressive dementia, acutely exacerbated by another cause  --MRI Brain w/wo contrast showed no acute changes, sequelae of chronic vessel disease  -infection less likely given workup  -EEG normal for age and state  Plan  -neuro consulted; likely delirium on top of underlying dementia  -DC 8/11 w/ donepizil  ???  Hyperlipidemia  -rosuvastatin 10  ???  Type 2 Diabetes  -sliding scale while in the hospital  ???  Hypertension  -on Atenolol 25 BID -hypertensive, start norvasc 5mg  on 8/9  -blood pressure labile, monitor  ???  Right Bundle Branch Block  -chronic, no concern for ACS  ???  At risk for DVT  -elevated D-dimer of ~1900 on time of admission  -lower extremity doppler US 8/5 no DVT at that time  -no chest pain, tachypnea, hypoxia, cough -- low clinical suspicion for PE  -enoxaparin ppx  ________________________________________________________________________  ???  Subjective  Julie Dennis is a 84 y.o. female. Patient appears chronological age, but is disheveled and kyphotic, fairly immobile in bed.   Tangential and lacks insight  Oriented x2  Denied pain, N/V  Reported feeling well.        Medications  Scheduled Meds:aspirin EC tablet 81 mg, 81 mg, Oral, BID  atenoloL (TENORMIN) tablet 25 mg, 25 mg, Oral, BID  cholecalciferol (VITAMIN D-3) tablet 1,000 Units, 1,000 Units, Oral, QDAY  enoxaparin (LOVENOX) syringe 40 mg, 40 mg, Subcutaneous, QDAY(21)  insulin aspart U-100 (NOVOLOG FLEXPEN) injection PEN 0-6 Units, 0-6 Units, Subcutaneous, ACHS (22)  latanoprost (XALATAN) 0.005 % ophthalmic solution 1 drop, 1 drop, Both Eyes, QHS  melatonin tablet 3 mg, 3 mg, Oral, QHS  rosuvastatin (CRESTOR) tablet 10 mg, 10 mg, Oral, QDAY  senna/docusate (SENOKOT-S) tablet 2 tablet,  2 tablet, Oral, BID  thiamine (VITAMIN B-1) tablet 100 mg, 100 mg, Oral, QDAY  ???  Continuous Infusions:  ??? lactated ringers infusion 50 mL/hr at 11/20/18 1700   ???  PRN and Respiratory Meds:acetaminophen Q6H PRN, docusate QDAY PRN, LORazepam Q6H PRN **OR** LORazepam  (ATIVAN)  injection Q6H PRN, melatonin QHS PRN  ???  ???  Review of Systems:  Review of systems not obtained from patient due to patient factors.    ???  Objective:   ???                       Vital Signs: Last Filed                 Vital Signs: 24 Hour Range   BP: 141/88 (08/06 0301)  Temp: 36.8 ???C (98.2 ???F) (08/06 0301)  Pulse: 74 (08/06 0407)  Respirations: 20 PER MINUTE (08/06 0301)  SpO2: 100 % (08/06 0301) BP: (129-162)/(58-96) Temp:  [36.3 ???C (97.4 ???F)-36.8 ???C (98.2 ???F)]   Pulse:  [74-113]   Respirations:  [18 PER MINUTE-20 PER MINUTE]   SpO2:  [96 %-100 %]    FLACC Total Score: 0 (11/20/18 0804)     Vitals:   ??? 11/19/18 1000   Weight: 54.4 kg (120 lb)       Intake/Output Summary:  (Last 24 hours)  ???  Intake/Output Summary (Last 24 hours) at 11/21/2018 0700  Last data filed at 11/21/2018 0302      Gross per 24 hour   Intake 520 ml   Output ???   Net 520 ml      Stool Occurrence: 0  ???  Physical Exam  General:  Alert, cooperative, no distress, appears stated age  Lungs:  Clear to auscultation bilaterally  Heart:   Regular rate and rhythm, S1, S2 normal, no murmur  Abdomen:  Soft, non-tender.  Bowel sounds normal.   Neurologic: no focal deficits grossly   Psych:   Labile, Delayed and Tangential, Slowed and Hallucinating, Delusional, Impaired and Inappropriate  ???  Lab Review  24-hour labs:          Results for orders placed or performed during the hospital encounter of 11/19/18 (from the past 24 hour(s))   POC GLUCOSE   ??? Collection Time: 11/20/18  8:00 AM   Result Value Ref Range   ??? Glucose, POC 167 (H) 70 - 100 MG/DL   POC GLUCOSE   ??? Collection Time: 11/20/18 11:45 AM   Result Value Ref Range   ??? Glucose, POC 135 (H) 70 - 100 MG/DL   CBC AND DIFF   ??? Collection Time: 11/20/18 12:26 PM   Result Value Ref Range   ??? White Blood Cells 6.5 4.5 - 11.0 K/UL   ??? RBC 4.89 4.0 - 5.0 M/UL   ??? Hemoglobin 15.0 12.0 - 15.0 GM/DL   ??? Hematocrit 45.7 (H) 36 - 45 %   ??? MCV 93.6 80 - 100 FL   ??? MCH 30.7 26 - 34 PG   ??? MCHC 32.8 32.0 - 36.0 G/DL   ??? RDW 13.8 11 - 15 %   ??? Platelet Count 171 150 - 400 K/UL   ??? MPV 10.7 7 - 11 FL   ??? Neutrophils 55 41 - 77 %   ??? Lymphocytes 31 24 - 44 %   ??? Monocytes 12 4 - 12 %   ??? Eosinophils 1 0 - 5 %   ??? Basophils 1 0 -  2 %   ??? Absolute Neutrophil Count 3.60 1.8 - 7.0 K/UL   ??? Absolute Lymph Count 2.02 1.0 - 4.8 K/UL   ??? Absolute Monocyte Count 0.75 0 - 0.80 K/UL   ??? Absolute Eosinophil Count 0.07 0 - 0.45 K/UL ??? Absolute Basophil Count 0.06 0 - 0.20 K/UL   TROPONIN-I   ??? Collection Time: 11/20/18 12:26 PM   Result Value Ref Range   ??? Troponin-I 0.02 0.0 - 0.05 NG/ML   BASIC METABOLIC PANEL   ??? Collection Time: 11/20/18  1:50 PM   Result Value Ref Range   ??? Sodium 141 137 - 147 MMOL/L   ??? Potassium 3.3 (L) 3.5 - 5.1 MMOL/L   ??? Chloride 99 98 - 110 MMOL/L   ??? CO2 30 21 - 30 MMOL/L   ??? Anion Gap 12 3 - 12   ??? Glucose 150 (H) 70 - 100 MG/DL   ??? Blood Urea Nitrogen 20 7 - 25 MG/DL   ??? Creatinine 0.77 0.4 - 1.00 MG/DL   ??? Calcium 9.8 8.5 - 10.6 MG/DL   ??? eGFR Non African American >60 >60 mL/min   ??? eGFR African American >60 >60 mL/min   POC GLUCOSE   ??? Collection Time: 11/20/18  5:09 PM   Result Value Ref Range   ??? Glucose, POC 183 (H) 70 - 100 MG/DL   POC GLUCOSE   ??? Collection Time: 11/20/18  9:24 PM   Result Value Ref Range   ??? Glucose, POC 211 (H) 70 - 100 MG/DL   ???  ???  Point of Care Testing  (Last 24 hours)  Glucose: (!) 150 (11/20/18 1350)  POC Glucose (Download): (!) 211 (11/20/18 2124)  ???  Radiology and other Diagnostics Review:    Pertinent radiology reviewed.  ???  Gladis Riffle, MD

## 2018-11-26 NOTE — Progress Notes
PHYSICAL THERAPY  PROGRESS NOTE        Name: Julie Dennis        MRN: [id number]          DOB: Sep 07, 1934          Age: 84 y.o.  Admission Date: 11/19/2018             LOS: 7 days        Mobility  Patient Turn/Position: Right  Progressive Mobility Level: Walk in room  Distance Walked (feet): 20 ft  Level of Assistance: Assist X2  Assistive Device: Hand Held  Time Tolerated: 11-30 minutes  Activity Limited By: Mental Status Variability    Subjective  Significant hospital events: 84 yo F PMH Hodgkins lymphoma, Aflutter not on AC, RBBB, hyperlipidemia, HTN, T2DM admitted after presenting to [facility] clinic appointment with altered mental status evidenced by hallucinations of angels and dogs.  Mental / Cognitive Status: Alert;Oriented;To Person;Cooperative;Follows Commands  Persons Present: Occupational Therapist  Pain: Patient has no complaint of pain  Comments: Patient pleasantly confused; unable to obtain accurate PLOF and home set-up information.     Bed Mobility/Transfer  Bed Mobility: Supine to Sit: Minimal Assist;x2 People;Head of Bed Elevated;Requires Extra Time;Safety Considerations;Assist with Trunk  Bed Mobility: Sit to Supine: Minimal Assist;HOB Elevated;Requires Extra Time;Safety Considerations;Assist with Trunk  Transfer Type: Sit to/from Stand  Transfer: Assistance Level: To/From;Bed;Minimal Assist;x2 People  Transfer: Assistive Device: Chief of Staff Assist  Transfers: Type Of Assistance: Verbal Cues;For Balance;For Strength Deficit;For Safety Considerations;Requires Extra Time  Other Transfer Type: Stand Pivot  Other Transfer: Assistance Level: To/From;Bed;Commode;Minimal Assist;x2 People  Other Transfer: Assistive Device: Hand Hold Assist  Other Transfer: Type Of Assistance: Verbal Cues;For Balance;For Strength Deficit;For Safety Considerations;Requires Extra Time  End Of Activity Status: In Bed;Nursing Notified;Instructed Patient to Request Assist with Mobility;Instructed Patient to Use Call Light Comments: pt retropulsive throughout; able to stand and ambulate around room; then reported need to use restroom so transferred to bedside commode and then back to bed    Balance  Sitting Balance: Static Sitting Balance;Dynamic Sitting Balance;2 UE Support;Minimal Assist  Standing Balance: Static Standing Balance;Dynamic Standing Balance;2 UE support;Minimal Assist;Moderate Assist;x2 People    Gait  Gait Distance: 20 feet  Gait: Assistance Level: Moderate Assist;x2 People;Safety Considerations  Gait: Assistive Device: Hand Hold Assist  Gait: Descriptors: Pace: Slow;Pathway deviations;Swing-Through Gait;Decreased knee extension in stance phase RLE;Decreased knee extension in stance phase LLE;No balance loss;Decreased step length  Comments: retropulsive throughout  Activity Limited By: Complaint of Fatigue;Weakness;Patient Choice    Education  Persons Educated: Patient  Patient Barriers To Learning: Confusion;Cognitive Deficits  Interventions: Repetition of Instructions;Demonstration Provided  Patient Response: More Instruction Required  Topics: Plan/Goals of PT Interventions;Mobility Progression;Safety Awareness;Up with Assist Only;Importance of Increasing Activity;Recommend Continued Therapy;Therapy Schedule    Assessment/Progress  Impaired Mobility Due To: Medical Status Limitation;Safety Concerns;Impaired Balance;Decreased Strength  Assessment/Progress: Should Improve w/ Continued PT    AM-PAC 6 Clicks Basic Mobility Inpatient  Turning from your back to your side while in a flat bed without using bed rails: A lot  Moving from lying on your back to sitting on the side of a flatbed without using bedrails : A Lot  Moving to and from a bed to a chair (including a wheelchair): A Lot  Standing up from a chair using your arms (e.g. wheelchair, or bedside chair): A Lot  To walk in hospital room: A Lot  Climbing 3-5 steps with a railing: Total  Raw Score: 11  Standardized (T-scale)  Score: 30.25 Basic Mobility CMS 0-100%: 66.76  CMS G Code Modifier for Basic Mobility: CL    Goals  Goal Formulation: Patient Unable to Participate in Goal Setting  Time For Goal Achievement: 5 days  Patient Will Go Supine To/From Sit: w/ Stand By Assist  Patient Will Transfer Bed/Chair: w/ Stand By Assist  Patient Will Ambulate: 51-100 Feet, w/ Dan Humphreys, w/ Minimal Assist    Plan  Treatment Interventions: Mobility Training;Strengthening;Balance Activities;Endurance Training  Plan Frequency: 3-5 Days per Week  PT Plan for Next Visit: Progress transfers and safety, gait as appropriate, sitting balance EOB    PT Discharge Recommendations  Recommendation: Inpatient setting  Patient Currently Requires Physical Assist With: All mobility    Therapist: Kaleen Odea, PT, DPT  Date: 11/26/2018

## 2018-11-26 NOTE — Progress Notes
OCCUPATIONAL THERAPY  PROGRESS NOTE      Name: Julie Dennis        MRN: [id number]          DOB: 04/17/1935          Age: 84 y.o.  Admission Date: 11/19/2018             LOS: 7 days    Mobility  Patient Turn/Position: Right  Progressive Mobility Level: Walk in room  Distance Walked (feet): 20 ft  Level of Assistance: Assist X2  Assistive Device: Hand Held  Time Tolerated: 11-30 minutes  Activity Limited By: Mental Status Variability    Subjective  Pertinent Dx per Physician: 84 yo F PMH Hodgkins lymphoma, Aflutter not on AC, RBBB, hyperlipidemia, HTN, T2DM admitted after presenting to [facility] clinic appointment with altered mental status evidenced by hallucinations of angels and dogs.  Precautions: Standard;Falls  Pain / Complaints: Patient agrees to participate in therapy  Comments: Pt is pleasantly confused. Pt sings and laughs to herself throughout therapy intervention.    Objective  Psychosocial Status: Willing and Cooperative to Participate  Persons Present: Physical Therapist    Home Living  Comment: Pt unable to provide PLOF. She reports she lives alone in a house with lots of stairs. Will follow up when family is available.    Vision  Current Vision: Does Not Wear Glasses  Comment: Pt denies vision deficits however is hallucinating and seeing angels and dogs.    ADL's  Where Assessed: Edge of Bed;Chair  LE Dressing Assist: Total Assist  LE Dressing Deficits: Don/Doff R Sock;Don/Doff L Sock  Toileting Assist: Maximum Assist  Toileting Deficits: Bedside Commode;Clothing Management Up;Clothing Management Down;Perineal Hygiene    ADL Mobility  Bed Mobility: Supine to Sit: Moderate assist  Bed Mobility: Sit to Supine: Minimal assist  Bed Mobility Comments: Assistance with trunk from supine to sit. Patient returns to supine with contact guard assistance.  Transfer Type: Sit to/from stand  Transfer: Assistance Level: From;Bed;Minimal assist;x2 people  Transfer: Assistive Device: Hand hold assist Transfer: Type of Assistance: For balance;For safety considerations  End of Activity Status: In bed;Nursing notified  Sitting Balance: Minimal assist  Standing Balance: Minimal assist;x2 people  Gait Distance: 30 feet  Gait: Assistance Level: Minimal assist;x2 people  Gait: Assistive Device: Hand hold assist  Gait Comments: Patient ambulates in room with hand hold assistance. Patient retropulsive in standing, unable to correct with verbal cues.    Activity Tolerance  Endurance: 2/5 Tolerates 10-20 Minutes Exercise w/Multiple Rests    Cognition  Overall Cognitive Status: Impaired  Expression: Increased Time for Expression;Distractable  Social Interaction: Encouragement/Coaxing to Participate;Cueing for Appropriateness;Increased Time to Adjust  Problem Solving: Cueing to Sequence Task  Memory: Unable to Provide Accurate Prior Level of Functioning History  Orientation: Alert & Oriented x1    UE AROM  Overall BUE AROM WNL: Yes  Grasp: Bilateral Grasp Functional for Activity  Comment: WFL during functional exam.    Education  Goal Formulation: Patient Unable to Participate in Goal Setting    Assessment  Assessment: Decreased ADL Status;Decreased UE Strength;Decreased Cognition;Decreased Endurance;Decreased Self-Care Trans;Decreased High-Level ADLs  Prognosis: Guarded;w/Cont OT s/p Acute Discharge  Goal Formulation: Patient    AM-PAC 6 Clicks Daily Activity Inpatient  Putting on and taking off regular lower body clothes?: A Lot  Bathing (Including washing, rinsing, drying): A Lot  Toileting, which includes using toilet, bedpan, or urinal: A Lot  Putting on and taking off regular upper body clothing: A Lot  Taking care of personal grooming such as brushing teeth: A Lot  Eating meals?: A Lot  Daily Activity Raw Score: 12  Standardized (t-scale) score: 30.6  CMS 0-100% Score: 66.57  CMS G Code Modifier: CL    Plan  OT Frequency: 3-5x/week  OT Plan for Next Visit: Longer distance ambulaiton, ADLs in the bathroom as cognition allows.    ADL Goals  Patient Will Perform All ADL's: w/ Stand By Assist  Patient Will Perform LE Dressing: w/ Stand By Assist  Patient Will Perform Toileting: w/ Stand By Assist    Functional Transfer Goals  Pt Will Perform All Functional Transfers: w/ Stand By Assist    OT Discharge Recommendations  Recommendation: Inpatient setting  Patient Currently Requires Physical Assist With: All mobility;All personal care ADLs;All home functioning ADLs      Therapist: Roxy Horseman, OTR/L [id number]  Date: 11/26/2018

## 2018-11-26 NOTE — Progress Notes
11/26/18 0011 11/26/18 0014   Vitals   BP (!) 88/46  (RN notified) 93/47   Mean NBP (Calculated) 60 MM HG 62 MM HG   BP Source Arm, Right Upper  --      Landry Corporal, MD notified of pt's BP above. Pt sleeping at this time. No new orders. Will CTM.

## 2018-11-27 ENCOUNTER — Encounter: Admit: 2018-11-27 | Discharge: 2018-11-27

## 2018-11-27 ENCOUNTER — Encounter: Admit: 2018-11-21 | Discharge: 2018-11-21

## 2018-11-27 ENCOUNTER — Encounter: Admit: 2018-11-19 | Discharge: 2018-11-27 | Disposition: A | Discharge status: Home Health

## 2018-11-27 ENCOUNTER — Emergency Department: Admit: 2018-11-19 | Discharge: 2018-11-19

## 2018-11-27 ENCOUNTER — Encounter: Admit: 2018-11-20 | Discharge: 2018-11-20

## 2018-11-27 ENCOUNTER — Ambulatory Visit: Admit: 2018-11-19 | Discharge: 2018-11-19

## 2018-11-27 DIAGNOSIS — G9340 Encephalopathy, unspecified: Secondary | ICD-10-CM

## 2018-11-27 DIAGNOSIS — R000 Tachycardia, unspecified: Secondary | ICD-10-CM

## 2018-11-27 DIAGNOSIS — F0390 Unspecified dementia without behavioral disturbance: Secondary | ICD-10-CM

## 2018-11-27 DIAGNOSIS — M1990 Unspecified osteoarthritis, unspecified site: Secondary | ICD-10-CM

## 2018-11-27 DIAGNOSIS — Z923 Personal history of irradiation: Secondary | ICD-10-CM

## 2018-11-27 DIAGNOSIS — R636 Underweight: Secondary | ICD-10-CM

## 2018-11-27 DIAGNOSIS — I4510 Unspecified right bundle-branch block: Secondary | ICD-10-CM

## 2018-11-27 DIAGNOSIS — Z1159 Encounter for screening for other viral diseases: Secondary | ICD-10-CM

## 2018-11-27 DIAGNOSIS — Z681 Body mass index (BMI) 19 or less, adult: Secondary | ICD-10-CM

## 2018-11-27 DIAGNOSIS — I10 Essential (primary) hypertension: Secondary | ICD-10-CM

## 2018-11-27 DIAGNOSIS — Z9221 Personal history of antineoplastic chemotherapy: Secondary | ICD-10-CM

## 2018-11-27 DIAGNOSIS — E785 Hyperlipidemia, unspecified: Secondary | ICD-10-CM

## 2018-11-27 DIAGNOSIS — Z8571 Personal history of Hodgkin lymphoma: Secondary | ICD-10-CM

## 2018-11-27 DIAGNOSIS — Z79899 Other long term (current) drug therapy: Secondary | ICD-10-CM

## 2018-11-27 LAB — POC GLUCOSE
Lab: 217 mg/dL — ABNORMAL HIGH (ref 70–100)
Lab: 241 mg/dL — ABNORMAL HIGH (ref 70–100)
Lab: 191 mg/dL — ABNORMAL HIGH (ref 70–100)

## 2018-11-27 MED ORDER — DONEPEZIL 5 MG PO TAB
5 mg | ORAL_TABLET | Freq: Every evening | ORAL | 0 refills | 90.00000 days | Status: AC
Start: 2018-11-27 — End: ?

## 2018-11-27 MED ORDER — DONEPEZIL 10 MG PO TAB
5 mg | Freq: Every day | ORAL | 0 refills | Status: DC
Start: 2018-11-27 — End: 2018-11-27
  Administered 2018-11-27: 16:00:00 5 mg via ORAL

## 2018-11-27 MED ORDER — MELATONIN 3 MG PO TAB
3 mg | Freq: Every evening | ORAL | 0 refills | 28.00000 days | Status: AC
Start: 2018-11-27 — End: ?

## 2018-11-27 MED ORDER — AMLODIPINE 5 MG PO TAB
5 mg | ORAL_TABLET | Freq: Every day | ORAL | 0 refills | Status: AC
Start: 2018-11-27 — End: ?

## 2018-11-27 MED ORDER — THIAMINE MONONITRATE (VIT B1) 100 MG PO TAB
100 mg | Freq: Every day | ORAL | 0 refills | 10.00000 days | Status: AC
Start: 2018-11-27 — End: ?

## 2018-11-27 NOTE — Discharge Instructions - Pharmacy
Physician Discharge Summary      Name: Julie Dennis  Medical Record Number: [id number]        Account Number:  [id number]  Date Of Birth:  [birth year]                         Age:  84 years   Admit date:  11/19/2018                     Discharge date:  11/27/2018    Attending Physician:  Dr. Hendricks Limes               Service: Med Private P- [phone]    Physician Summary completed by: Bobbye Riggs, MS     Reason for hospitalization: Altered Mental Status     Significant PMH:   Medical History:   Diagnosis Date   ??? Arthritis    ??? Atrial flutter (HCC)    ??? Dizziness 04/03/2018   ??? DM (diabetes mellitus) (HCC)    ??? Hodgkin's lymphoma (HCC)    ??? Hypertension          Allergies: Betadine [povidone-iodine]; Contrast dye iv, iodine containing [iodinated contrast media]; Penicillins; and Sulfa (sulfonamide antibiotics)    Admission Physical Exam notable for:    Physical Exam  Vitals signs and nursing note reviewed.   Constitutional:       Appearance: Normal appearance. She is normal weight.   HENT:      Head: Normocephalic and atraumatic.      Right Ear: External ear normal.      Left Ear: External ear normal.      Nose: Nose normal.      Mouth/Throat:      Mouth: Mucous membranes are moist.   Eyes:      Extraocular Movements: Extraocular movements intact.      Conjunctiva/sclera: Conjunctivae normal.      Pupils: Pupils are equal, round, and reactive to light.   Neck:      Musculoskeletal: Neck supple.   Cardiovascular:      Rate and Rhythm: Regular rhythm. Tachycardia present.   Pulmonary:      Effort: Pulmonary effort is normal. No respiratory distress.      Breath sounds: Normal breath sounds.   Abdominal:      General: Bowel sounds are normal. There is no distension.      Palpations: Abdomen is soft.      Tenderness: There is no abdominal tenderness.   Musculoskeletal: Normal range of motion.         General: No deformity.      Right lower leg: No edema.      Left lower leg: No edema.   Skin:     General: Skin is warm and dry. Findings: No rash.   Neurological:      Mental Status: She is alert. She is disoriented.      Cranial Nerves: No cranial nerve deficit.      Sensory: No sensory deficit.      Motor: No weakness.   Psychiatric:         Attention and Perception: She is inattentive.         Speech: Speech is tangential.         Behavior: Behavior is hyperactive. Behavior is cooperative.         Thought Content: Thought content is delusional.      Comments: Patient responding to internal stimulus  Admission Lab/Radiology studies notable for:     CT Head/MRI head unremarkable for acute process    Brief Hospital Course:  The patient was admitted and the following issues were addressed during this hospitalization: (with pertinent details).  Patient was admitted to the ED after 2 days of increasingly strange behavior per son. Her initial differential included infectious vs metabolic vs medication overdose vs unrecognized progressive dementia vs CNS lymphoma. Psych and Neuro were consulted. She received thorough CXR, UA, CT Head wo Contrast, MRI w/wo contrast, EEG which were all unremarkable for acute processes explaining her condition. She was given several days to improve to r/o incorrect medication administration at home, as there was some suspicion she had ended up taking her husbands psychiatric meds. Thyroid function and vitamin levels were reassuring. By the end of the hospital course, no acute etiology had been identified, and it seemed most likely that her presentation was an acute bout of delirium superimposed on progressive dementia.    During her admission, she was noted to have an episode of tachycardia to the 140s. Given her elevated D-Dimer on admission, there was concern for PE. However, she had no cough/dyspnea/pain/hypoxia and doppler US LE was negative for DVT. Given low clinical suspicion for PE, and patients allergy to contrast, CTA was not ordered. EKG showed no alarming changes from prior EKG, and tachycardia responded to fluid bolus.     Patient is being discharged on 5mg  Donepezil, as recommended by neuro, and 5mg  Amlodipine, as well as her PTA meds.       Condition at Discharge: Stable    Discharge Diagnoses:  Acute delirium on underlying progressive dementia    Hospital Problems        Active Problems    * (Principal) Altered behavior          Surgical Procedures: None    Significant Diagnostic Studies and Procedures: noted in brief hospital course    Consults:  Neurology and Psychology    Patient Disposition: Home with Home Health Care       Patient instructions/medications:       Diabetic Diet    You should eat between 1600 and 2000 calories per day.  This is equal to 60g (grams) of carbohydrates per meal, and 30g of carbohydrates for a bedtime snack.    If you have questions about your diet after you go home, you can call a dietitian at [phone].     Cardiac Diet    Limiting unhealthy fats and cholesterol is the most important step you can take in reducing your risk for cardiovascular disease.  Unhealthy fats include saturated and trans fats.  Monitor your sodium and cholesterol intake.  Restrict your sodium to 2g (grams) or 2000mg  (milligrams) daily, and your cholesterol to 200mg  daily.    If you have questions regarding your diet at home, you may contact a dietitian at [phone].       Report These Signs and Symptoms    Please contact your doctor if you have any of the following symptoms: temperature higher than 100.4 degrees F, uncontrolled pain, persistent nausea and/or vomiting, difficulty breathing, chest pain, severe abdominal pain or headache     Questions About Your Stay    If you have an emergency after discharge, please dial 9-1-1.    You may contact your discharging physician up to 7 days after discharge for questions about your hospitalization, discharge instructions, or medications by calling [phone] during regular business hours (8AM-4PM) and asking  to speak to the doctor listed on the discharge information.       If you are not calling during business hours, ask for the on-call doctor.    If you have new  or worsening symptoms, you may be directed to your primary care provider (PCP) for ongoing questions, an Emergency Department, or an Urgent Care Clinic for a more immediate evaluation.    For all calls or questions more than 7 days after discharge, please contact your primary care provider (PCP).    For medications after discharge: pain (opioid) medicine cannot be refilled or prescribed by calling your discharging physician.  These medications need to be filled by your primary care provider (PCP).  Regular refill requests should be directed to your primary care provider (PCP).     Discharging attending physician: Gladis Riffle [[id number]]      Activity as Tolerated    It is important to keep increasing your activity level after you leave the hospital.  Moving around can help prevent blood clots, lung infection (pneumonia) and other problems.  Gradually increasing the number of times you are up moving around will help you return to your normal activity level more quickly.  Continue to increase the number of times you are up to the chair and walking daily to return to your normal activity level. Begin to work toward your normal activity level at discharge     Return Appointment    Future Appointments  12/05/2018  1:40 PM    Dierdre Harness, MD          [facility]       [facility]      Current Discharge Medication List       START taking these medications    Details   amLODIPine (NORVASC) 5 mg tablet Take one tablet by mouth daily.  Qty: 90 tablet, Refills: 0    PRESCRIPTION TYPE:  Normal      donepeziL (ARICEPT) 5 mg tablet Take one tablet by mouth at bedtime daily.  Qty: 90 tablet, Refills: 0    PRESCRIPTION TYPE:  Normal      melatonin 3 mg tab Take one tablet by mouth at bedtime daily.    PRESCRIPTION TYPE:  OTC thiamine (VITAMIN B-1) 100 mg tablet Take one tablet by mouth daily.    PRESCRIPTION TYPE:  OTC          CONTINUE these medications which have NOT CHANGED    Details   acetaminophen SR (TYLENOL) 650 mg tablet Take 650 mg by mouth every 6 hours as needed for Pain.    PRESCRIPTION TYPE:  Historical Med      aspirin EC 81 mg tablet Take 81 mg by mouth twice daily. Take with food.     PRESCRIPTION TYPE:  Historical Med      atenoloL (TENORMIN) 25 mg tablet Take one tablet by mouth twice daily.  Qty: 180 tablet, Refills: 3    PRESCRIPTION TYPE:  Normal      cholecalciferol (VITAMIN D) 1,000 units tablet Take 1,000 Units by mouth. 3-4 times weekly    PRESCRIPTION TYPE:  Historical Med      ciclopirox (PENLAC) 8 % topical solution Apply 6.6 mL topically to affected area at bedtime daily.  Qty: 6.6 mL, Refills: 0    PRESCRIPTION TYPE:  Normal      diclofenac (VOLTAREN) 1 % topical gel Apply two g topically to affected area three times daily.  Qty: 300 g, Refills: 0    PRESCRIPTION  TYPE:  Normal      FLAXSEED PO Take 2,800 mg by mouth daily.    PRESCRIPTION TYPE:  Historical Med      glucosamine 500 mg tab Take 500 mg by mouth daily.    PRESCRIPTION TYPE:  Historical Med      glyBURIDE (DIABETA) 2.5 mg tablet Take one tablet by mouth daily with breakfast. Take with food.  Qty: 90 tablet, Refills: 1    PRESCRIPTION TYPE:  Normal      latanoprost (XALATAN) 0.005 % ophthalmic solution Apply 1 drop to both eyes at bedtime daily.    PRESCRIPTION TYPE:  Historical Med      linaGLIPtin (TRADJENTA) 5 mg tablet Take one tablet by mouth daily.  Qty: 90 tablet, Refills: 1    PRESCRIPTION TYPE:  Normal      menthol (BIOFREEZE (MENTHOL)) 4 % gel Apply  topically to affected area as Needed.    PRESCRIPTION TYPE:  Historical Med      metFORMIN-ER (FORTAMET) 500 mg extended release tablet Take 500 mg by mouth twice daily. Indications: Takes once daily    PRESCRIPTION TYPE:  Historical Med      MULTIVITAMIN PO Take 1 tablet by mouth daily. PRESCRIPTION TYPE:  Historical Med      rosuvastatin (CRESTOR) 10 mg tablet Take one tablet by mouth daily.  Qty: 90 tablet, Refills: 1    PRESCRIPTION TYPE:  Normal  Associated Diagnoses: Hyperlipidemia, unspecified hyperlipidemia type      senna/docusate (SENOKOT-S) 8.6/50 mg tablet Take 2 tablets by mouth twice daily.    PRESCRIPTION TYPE:  Historical Med           Scheduled appointments:    Dec 05, 2018  1:40 PM CDT  RETURN PT LONG with Dierdre Harness, MD  Internal Medicine (Internal Medicine) [address]  [place] [address]  [phone]          Pending items needing follow up: None    Signed:  Bobbye Riggs, MS  11/27/2018      cc:  Primary Care Physician:  Dierdre Harness   Verified  Referring physicians:  Dierdre Harness, MD   Additional provider(s):

## 2018-11-27 NOTE — Progress Notes
I have reviewed the notes, assessment, and/or procedures performed by H. Settles, RN and concur with her documentation unless otherwise noted.

## 2018-11-27 NOTE — Progress Notes
Patient being d/c from [place] today on [facility]. Case manager calling in regards to having PCP follow for [facility]. Dr. Kathe Becton OK to follow for [facility]. Malva Cogan, RN

## 2018-11-28 ENCOUNTER — Encounter: Admit: 2018-11-28 | Discharge: 2018-11-28

## 2018-11-28 NOTE — Telephone Encounter
Hospital Discharge Follow Up      Reached Patient:Yes     Admission Information:     Hospital Name: [facility]  Admission Date: 11/19/2018  Discharge Date: 11/27/2018   Admission Diagnosis: Altered behavior  Discharge Diagnosis: Altered behavior, Type 2 diabetes mellitus, Altered mental status unspecified altered mental status type-primary  Has there been a discharge within the last 30 days? No  If yes, reason: Activity as tolerated  Hospital Services: Unplanned  Today's call is 1(business) days post discharge      Discharge Instruction Review   Did patient receive and understand discharge instructions? Yes    Home Health ordered? Yes                 [phone]   Has Home Health agency contacted patient? Yes   Caregiver assistance in the home? No   Are there concerns regarding the patient's ADL'S? Patient husband and daughter are assisting patient with cares  Is patient a fall risk? Yes    Special diet? Yes If yes, type: cardiac diet and diabetic diet      Medication Reconciliation    Changes to pre-hospital medications? No  Were new prescriptions filled?Yes   START taking:  amlodipine (NORVASC) Start taking on: November 28, 2018  donepezil (ARICEPT)  melatonin  thiamine (VITAMIN B-1) Start taking on: November 28, 2018  Meds reviewed and reconciled?Yes  ??? acetaminophen SR (TYLENOL) 650 mg tablet Take 650 mg by mouth every 6 hours as needed for Pain.   ??? amLODIPine (NORVASC) 5 mg tablet Take one tablet by mouth daily.   ??? aspirin EC 81 mg tablet Take 81 mg by mouth twice daily. Take with food.    ??? atenoloL (TENORMIN) 25 mg tablet Take one tablet by mouth twice daily.   ??? cholecalciferol (VITAMIN D) 1,000 units tablet Take 1,000 Units by mouth. 3-4 times weekly   ??? ciclopirox (PENLAC) 8 % topical solution Apply 6.6 mL topically to affected area at bedtime daily.   ??? diclofenac (VOLTAREN) 1 % topical gel Apply two g topically to affected area three times daily.   ??? donepeziL (ARICEPT) 5 mg tablet Take one tablet by mouth at bedtime daily.   ??? FLAXSEED PO Take 2,800 mg by mouth daily.   ??? glucosamine 500 mg tab Take 500 mg by mouth daily.   ??? glyBURIDE (DIABETA) 2.5 mg tablet Take one tablet by mouth daily with breakfast. Take with food.   ??? latanoprost (XALATAN) 0.005 % ophthalmic solution Apply 1 drop to both eyes at bedtime daily.   ??? linaGLIPtin (TRADJENTA) 5 mg tablet Take one tablet by mouth daily.   ??? melatonin 3 mg tab Take one tablet by mouth at bedtime daily.   ??? menthol (BIOFREEZE (MENTHOL)) 4 % gel Apply  topically to affected area as Needed.   ??? metFORMIN-ER (FORTAMET) 500 mg extended release tablet Take 500 mg by mouth twice daily. Indications: Takes once daily   ??? MULTIVITAMIN PO Take 1 tablet by mouth daily.   ??? rosuvastatin (CRESTOR) 10 mg tablet Take one tablet by mouth daily.   ??? senna/docusate (SENOKOT-S) 8.6/50 mg tablet Take 2 tablets by mouth twice daily.   ??? thiamine (VITAMIN B-1) 100 mg tablet Take one tablet by mouth daily.         Understanding Condition   Having any current symptoms? Yes, Patient daughter states patient is still more confused then her baseline but states she is  eating well.  Patient daughter denies patient having fever/chills, nausea/vomiting, diarrhea/constipation.  Patient daughter states patient has a blood pressure cuff but has not checked her blood pressure since hospital discharge and has not checked her blood sugar since hospital discharge.  Patient understands when to seek additional medical care? Yes   Other instructions provided : Activity as tolerated     Scheduling Follow-up Appointment   Upcoming appointment date and time and with whom scheduled:   Future Appointments   Date Time Provider Department Center   12/05/2018  1:40 PM Dierdre Harness, MD [facility]     PCP appointment scheduled?Yes, Date: 12/05/2018   PCP primary location: [facility]  [facility] Gen Medicine  Specialist appointment scheduled? No Both PCP and Specialist appointment scheduled: No  Is assistance with transportation needed?No      Yancey Flemings

## 2018-12-03 ENCOUNTER — Encounter: Admit: 2018-12-03 | Discharge: 2018-12-03 | Payer: MEDICARE

## 2018-12-03 NOTE — Telephone Encounter
Brandy, RN from [facility] calling in regards to patient experiencing acute onset of delirium. Family says this is something new for her. Recently d/c from [place] on 11/27/2018.  She has been taking care of her husband since before her hospitalization last month. She was not alert to day/time. She stated that she thought it was close to thanksgiving/christmas. She was making rhymes with every sentence, as well.     The daughter states she is not sleeping through the night. Been taking melatonin but not working for her.     She also has not had a bowel movement since Friday per patient's daughter.     Patient is not having any pain. VSS. Brandy, RN would like to know how Dr. Kathe Becton would like to proceed or if orders should be placed.     Will route to PCP.     Malva Cogan, RN

## 2018-12-04 NOTE — Telephone Encounter
Patient has an appointment tomorrow 12/05/2018. Will evaluate at this time. Spoke to Dewey, RN [facility] and she will relay message about coming in to visit with physician. Malva Cogan, RN

## 2018-12-04 NOTE — Telephone Encounter
Laymond Purser, RN from [facility]. LVM with nurse number to call back with plan. Malva Cogan, RN

## 2018-12-05 ENCOUNTER — Encounter: Admit: 2018-12-05 | Discharge: 2018-12-05

## 2018-12-05 DIAGNOSIS — I4892 Unspecified atrial flutter: Secondary | ICD-10-CM

## 2018-12-05 DIAGNOSIS — I10 Essential (primary) hypertension: Secondary | ICD-10-CM

## 2018-12-05 DIAGNOSIS — R42 Dizziness and giddiness: Secondary | ICD-10-CM

## 2018-12-05 DIAGNOSIS — C8190 Hodgkin lymphoma, unspecified, unspecified site: Secondary | ICD-10-CM

## 2018-12-05 DIAGNOSIS — E1169 Type 2 diabetes mellitus with other specified complication: Secondary | ICD-10-CM

## 2018-12-05 DIAGNOSIS — M1990 Unspecified osteoarthritis, unspecified site: Secondary | ICD-10-CM

## 2018-12-05 DIAGNOSIS — E782 Mixed hyperlipidemia: Secondary | ICD-10-CM

## 2018-12-05 DIAGNOSIS — E119 Type 2 diabetes mellitus without complications: Secondary | ICD-10-CM

## 2018-12-05 MED ORDER — QUETIAPINE 50 MG PO TAB
50 mg | ORAL_TABLET | Freq: Two times a day (BID) | ORAL | 2 refills | Status: CN
Start: 2018-12-05 — End: ?

## 2018-12-05 MED ORDER — MELATONIN 3 MG PO TAB
3 mg | Freq: Every evening | ORAL | 0 refills | Status: CN
Start: 2018-12-05 — End: ?

## 2018-12-05 MED ORDER — TRAZODONE 50 MG PO TAB
50-100 mg | ORAL_TABLET | Freq: Every evening | ORAL | 2 refills | Status: AC
Start: 2018-12-05 — End: ?

## 2018-12-05 NOTE — Progress Notes
Obtained patient's, or patient proxy's, verbal consent to treat them and their agreement to [facility] financial policy and NPP via this telehealth visit during the [facility] Emergency      Chief Complaint   Patient presents with   ??? Follow Up           History of Present Illness    Julie Dennis is 84 y.o. female patient who presents to clinic for   Chief Complaint   Patient presents with   ??? Follow Up       Presents to clinic for:  ER/Hospital Follow-up.    Admitted 11/19/2018-11/27/2018 for AMS.  There was some concern of patient taking husband's psychiatric medications.  No findings with regard to inpatient work-up.      Patient was previously following Dr. Clayton Lefort.  Patient was previously functional and independent of ADLs until this recent hospitalization.  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 No history of dementia or psychiatric illness.      ER/Hospital Course:      Brief Hospital Course:  The patient was admitted and the following issues were addressed during this hospitalization: (with pertinent details).  Patient was admitted to the ED after 2 days of increasingly strange behavior per son. Her initial differential included infectious vs metabolic vs medication overdose vs unrecognized progressive dementia vs CNS lymphoma. Psych and Neuro were consulted. She received thorough CXR, UA, CT Head wo Contrast, MRI w/wo contrast, EEG which were all unremarkable for acute processes explaining her condition. She was given several days to improve to r/o incorrect medication administration at home, as there was some suspicion she had ended up taking her husbands psychiatric meds. Thyroid function and vitamin levels were reassuring. By the end of the hospital course, no acute etiology had been identified, and it seemed most likely that her presentation was an acute bout of delirium superimposed on progressive dementia.  ???  During her admission, she was noted to have an episode of tachycardia to the 140s. Given her elevated D-Dimer on admission, there was concern for PE. However, she had no cough/dyspnea/pain/hypoxia and doppler US LE was negative for DVT. Given low clinical suspicion for PE, and patients allergy to contrast, CTA was not ordered. EKG showed no alarming changes from prior EKG, and tachycardia responded to fluid bolus.   ???  Patient is being discharged on 5mg  Donepezil, as recommended by neuro, and 5mg  Amlodipine, as well as her PTA meds.       Patient's son Nedra Hai Yehuda Mao) is historian today.  Patient spend 4 days at her home with home health- but son moved patient to live with him.  Patient's son is working from home.  He did notice patient was better with mentation when she lived at her place.    Patient today is A&O x 2, does not know place- [place].  Patient's son reports that patient has been the same since discharge- and constantly talking about meaningless subjects.   There was suspicion that patient was taking her husband's psychiatric medications.  Patient's son states she is supervised 24-7 and that have made sure she cannot get into any of the patient's husband's medications.  Patient has not been sleeping much.  She has been talking for over a day and a half and then naps throughout the day. Patient can be agitated when she hears certain noises- such as creeking of the stairs.  Patient's son also reports she has attention-seeking  behavior and if the kids talk to their father, the patient will interfere.  She is on the melatonin but has not helped with sleep and patient is up all night.        Patient is not complaining of any pain, no burning when urinates.  She was constipated, but had bowel movement yesterday evening.      Patient was constipated- but has not been since  taking Senokot.  She is not on crestor or linigliptin.  She is not on her other diabetic medications. She has been eating better.  At least 3x a day.  She is also doing Ensure BID.        Medical History:   Diagnosis Date   ??? Arthritis    ??? Atrial flutter (HCC)    ??? Dizziness 04/03/2018   ??? DM (diabetes mellitus) (HCC)    ??? Hodgkin's lymphoma (HCC)    ??? Hypertension        No past surgical history on file.    family history includes Cancer-Lung in her father; High Cholesterol in her mother; Hypertension in her father and mother.    Social History     Socioeconomic History   ??? Marital status: Married     Spouse name: Not on file   ??? Number of children: Not on file   ??? Years of education: Not on file   ??? Highest education level: Not on file   Occupational History   ??? Not on file   Tobacco Use   ??? Smoking status: Never Smoker   ??? Smokeless tobacco: Never Used   Substance and Sexual Activity   ??? Alcohol use: Not Currently     Frequency: Never   ??? Drug use: Never   ??? Sexual activity: Not on file   Other Topics Concern   ??? Not on file   Social History Narrative   ??? Not on file       Social History     Tobacco Use   ??? Smoking status: Never Smoker   ??? Smokeless tobacco: Never Used   Substance Use Topics   ??? Alcohol use: Not Currently     Frequency: Never          Review of Systems   Respiratory: Negative for shortness of breath.    Cardiovascular: Negative for chest pain.   Gastrointestinal: Positive for constipation. Negative for abdominal pain, diarrhea, nausea and vomiting.   Endocrine: Negative for polyuria.   Genitourinary: Negative for difficulty urinating and dysuria.   Neurological: Negative for weakness and numbness.   Psychiatric/Behavioral: Positive for agitation, behavioral problems and sleep disturbance.         Objective:         ??? acetaminophen SR (TYLENOL) 650 mg tablet Take 650 mg by mouth every 6 hours as needed for Pain.   ??? amLODIPine (NORVASC) 5 mg tablet Take one tablet by mouth daily.   ??? aspirin EC 81 mg tablet Take 81 mg by mouth twice daily. Take with food. ??? atenoloL (TENORMIN) 25 mg tablet Take one tablet by mouth twice daily.   ??? cholecalciferol (VITAMIN D) 1,000 units tablet Take 1,000 Units by mouth. 3-4 times weekly   ??? ciclopirox (PENLAC) 8 % topical solution Apply 6.6 mL topically to affected area at bedtime daily.   ??? diclofenac (VOLTAREN) 1 % topical gel Apply two g topically to affected area three times daily.   ??? donepeziL (ARICEPT) 5 mg tablet Take one tablet by mouth  at bedtime daily.   ??? FLAXSEED PO Take 2,800 mg by mouth daily.   ??? glucosamine 500 mg tab Take 500 mg by mouth daily.   ??? glyBURIDE (DIABETA) 2.5 mg tablet Take one tablet by mouth daily with breakfast. Take with food.   ??? latanoprost (XALATAN) 0.005 % ophthalmic solution Apply 1 drop to both eyes at bedtime daily.   ??? linaGLIPtin (TRADJENTA) 5 mg tablet Take one tablet by mouth daily.   ??? melatonin 3 mg tab Take one tablet by mouth at bedtime daily.   ??? menthol (BIOFREEZE (MENTHOL)) 4 % gel Apply  topically to affected area as Needed.   ??? metFORMIN-ER (FORTAMET) 500 mg extended release tablet Take 500 mg by mouth twice daily. Indications: Takes once daily   ??? MULTIVITAMIN PO Take 1 tablet by mouth daily.   ??? rosuvastatin (CRESTOR) 10 mg tablet Take one tablet by mouth daily.   ??? senna/docusate (SENOKOT-S) 8.6/50 mg tablet Take 2 tablets by mouth twice daily.   ??? thiamine (VITAMIN B-1) 100 mg tablet Take one tablet by mouth daily.   ??? traZODone (DESYREL) 50 mg tablet Take one tablet to two tablets by mouth at bedtime daily.     Vitals:    12/05/18 1336   Weight: 42.6 kg (94 lb)   Height: 167.6 cm (66)   PainSc: Zero     Vitals:    12/05/18 1336   Weight: 42.6 kg (94 lb)   Height: 167.6 cm (66)   PainSc: Zero       Body mass index is 15.17 kg/m???.     Physical Exam  Constitutional:       General: She is not in acute distress.     Appearance: She is well-developed.   HENT:      Head: Normocephalic and atraumatic.   Eyes:      Conjunctiva/sclera: Conjunctivae normal.   Neck: Musculoskeletal: Normal range of motion.   Pulmonary:      Effort: Pulmonary effort is normal.   Musculoskeletal: Normal range of motion.   Skin:     General: Skin is warm and dry.   Neurological:      Mental Status: She is alert. She is disoriented.   Psychiatric:         Speech: Speech is tangential.         Behavior: Behavior is agitated.         Labwork reviewed:  Lab Results   Component Value Date/Time    TSH 1.78 11/19/2018 10:15 AM    CHOL 208 (H) 03/19/2018 02:50 AM    TRIG 65 03/19/2018 02:50 AM    HDL 84 03/19/2018 02:50 AM    LDL 91 03/19/2018 02:50 AM    NA 139 11/26/2018 06:00 AM    K 4.5 11/26/2018 06:00 AM    CL 102 11/26/2018 06:00 AM    CO2 28 11/26/2018 06:00 AM    GAP 9 11/26/2018 06:00 AM    BUN 19 11/26/2018 06:00 AM    CR 0.65 11/26/2018 06:00 AM    GLU 172 (H) 11/26/2018 06:00 AM    CA 9.6 11/26/2018 06:00 AM    PO4 3.8 11/26/2018 06:00 AM    ALBUMIN 4.4 11/19/2018 10:15 AM    TOTPROT 8.0 11/19/2018 10:15 AM    ALKPHOS 74 11/19/2018 10:15 AM    AST 39 11/19/2018 10:15 AM    ALT 29 11/19/2018 10:15 AM    TOTBILI 0.8 11/19/2018 10:15 AM    GFR >60  11/26/2018 06:00 AM    GFRAA >60 11/26/2018 06:00 AM            Assessment and Plan:  Julie Dennis is a 84 y.o. female who presents 12/05/2018 for   Chief Complaint   Patient presents with   ??? Follow Up      Julie Dennis was seen today for follow up.    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  Estimated counseling time 25 minutes.      Patient Instructions     Thank you for coming into your appointment today.  I appreciate the opportunity to care for you.     Our plan from this visit:  ? Please re-start diabetic medications- metformin, Tradjenta, glyburide  ? Keep blood pressure log  ? Start trazodone  ? I have referred you to Geriatrics and Neurology- memory clinic      The phone number for my nurse, Luther Parody is [phone].  Our Fax number is [phone].      You can also reach me by sending an email message through MyChart email that links directly to your chart.  If you are on Mychart patient portal, please expect to receive your results and advice about your results on Mychart at [email or web address]  If you have multiple questions or would like to have a discussion regarding lab results or further treatment, please call to create an appointment so we can discuss in person as your time is important to me as is my other patients' time.      If you are not on Mychart patient portal, non urgent results will be relayed to you through a letter.  Urgent results will be called to you.  Please make sure your address and phone number are correct.      You are due for these screening tests and/ or vaccines.  If you believe you have completed these items, please bring [place] these records:  Health Maintenance Due   Topic Date Due   ??? MEDICARE ANNUAL WELLNESS VISIT  08-23-36   ??? FOOT EXAM  04/17/1953   ??? DTAP/TDAP VACCINES (1 - Tdap) 04/17/1955   ??? PHYSICAL (COMPREHENSIVE) EXAM  04/17/1953 ??? DILATED EYE EXAM  04/17/1955   ??? MICROALBUMIN  04/17/1955   ??? HBA1C  04/17/1955   ??? SHINGLES RECOMBINANT VACCINE (1 of 2) 04/17/1987        You have completed these vaccines:  Immunization  History   Administered Date(s) Administered   ??? Pneumococcal Vaccine(13-Val Peds/immunocompromised adult) 02/03/2014          Your time is important and if you had to wait at all today, I apologize. My goal is to run exactly on time; however, on occasion, I get behind in clinic due to unexpected patient issues.        Take care,  Dr. Gerhard Munch, MD and the General [facility], [facility]. [phone]      How to Help Prevent Delirium???   If you have a friend or family member who is at risk for delirium, you can do things to help. There's no guarantee that these measures will prevent delirium. Delirium is not always preventable. That???s because sometimes delirium results from infections, medicines, or other causes that a healthcare provider must address. But they may reduce risk.   5 steps for prevention  It may take days, weeks, or even months for delirium to go away, so prevention is important. In some cases, a person may not fully recover. Here are some steps you can take:   Step 1. Help the person stay focused and present  ??? Announce visitors whenever they enter the room.  ??? Make sure the person has access to eyeglasses, if needed.  ??? Make sure the person has a working hearing aid, if needed.  ??? Keep a regular routine for the person during the day.  ??? Put a clock and calendar in the room. Mention the day, date, and time throughout the day. Keep the person oriented to place and date.  ??? Tell the person when it???s time for something, such as a meal, physical activity, or bedtime.  ??? Put a few familiar objects around the room.  ??? Make sure the person has a window and can see the outdoors and sunlight.  ??? Make sure the room has good lighting during the day even if it???s dark outside. ??? Put a TV or radio in the room so the person can keep in touch with the outside world.  ??? Encourage the person to be active during the day, and keep a normal sleep schedule at night.  Step 2. Keep a calm environment  ??? Limit the number of people allowed in the person???s room.  ??? Talk with the person calmly.  ??? Don???t argue with the person.  ??? Keep noise levels low.  ??? Don???t play loud music or have the TV volume too loud.  ??? If at home, keep noisy children at a distance when possible.  ??? Take unnecessary objects away and don???t let the room get cluttered. But some familiar objects may be helpful to keep the person oriented.  ??? Make sure the room is a comfortable temperature.  Step 3. Help the person get good sleep  ??? Make sure the person keeps to regular sleep and wake times.  ??? Dim the lights in the evening. Turn off room and hallways lights after bedtime. Turn off the TV at night.  ??? Reduce lights and sounds from medical machines at night.  ??? Keep the room quiet at night.  ??? Keep the room bright with lights and open curtains during the day.  ??? Make sure the person avoids caffeine after noon.  ??? Ask hospital staff and others to limit unnecessary nighttime visits.  ??? Give the person sleep aids, such as an eye mask or ear plugs. Don't give the person sleeping pills.  ??? Ask the person if  he or she is sleeping through the night.??? (Note: A delirious person may not be able to give an accurate history.)  Step 4. Help the person stay healthy  ??? Make sure the person eats a healthy diet on a regular meal schedule.  ??? Make sure the person drinks enough fluids, especially if he or she has diarrhea, fever, or other symptoms that can cause dehydration.  ??? Make sure the person has regular bowel and bladder habits.  ??? Make sure he or she takes all medicine, as needed, on schedule.  ??? Help the person get regular physical activity, if possible.  ??? Tell the person???s healthcare provider right away if you see signs of a health concern, such as fever, pain, or any change in condition.  Step 5. Keep in contact with healthcare providers  ??? Ask the person???s healthcare provider about their medicines. Some medicines can cause delirium.  ??? Get help if the person needs to stop taking alcohol or psychoactive medicines.    When you need support  Talk with your healthcare provider about your role as a caregiver. Your provider may have information for you about support groups and other resources. You can also contact the [facility] at LinkCuff.co.uk.   Nancie Neas last reviewed this educational content on 01/15/2018  ??? 2000-2020 [employer], [place]. [address] [place], [place] [address]. All rights reserved. This information is not intended as a substitute for professional medical care. Always follow your healthcare professional's instructions.              Return in about 4 weeks (around 01/02/2019) for Follow-up/20 min appt- Telehealth.         Dierdre Harness, MD  [facility]

## 2018-12-06 ENCOUNTER — Ambulatory Visit: Admit: 2018-12-05 | Discharge: 2018-12-06

## 2018-12-06 ENCOUNTER — Encounter: Admit: 2018-12-06 | Discharge: 2018-12-06

## 2018-12-06 DIAGNOSIS — F0391 Unspecified dementia with behavioral disturbance: Secondary | ICD-10-CM

## 2018-12-06 DIAGNOSIS — Z09 Encounter for follow-up examination after completed treatment for conditions other than malignant neoplasm: Principal | ICD-10-CM

## 2018-12-06 MED ORDER — GLYBURIDE 2.5 MG PO TAB
2.5 mg | ORAL_TABLET | Freq: Every day | ORAL | 1 refills | Status: AC
Start: 2018-12-06 — End: ?

## 2018-12-06 MED ORDER — LINAGLIPTIN 5 MG PO TAB
5 mg | ORAL_TABLET | Freq: Every day | ORAL | 1 refills | 90.00000 days | Status: AC
Start: 2018-12-06 — End: ?

## 2018-12-06 NOTE — Progress Notes
PA submitted for glyburpide.Awaiting response from insurance. Malva Cogan, RN

## 2018-12-06 NOTE — Telephone Encounter
Spoke with patient's son Julie Dennis regarding medication. He states he gave his mother the trazadone last night and it helped her significantly. He will continue having Julie [place] take this medication at night. He will have Julie [place] inform me of fax number so I can send lab orders to the [place] facility. He is also needing scripts for the tradjenta and glyburide sent to the their pharmacy in [place]. Will send scripts and he will be in touch to follow up.     Julie Cogan, RN

## 2018-12-09 ENCOUNTER — Encounter: Admit: 2018-12-09 | Discharge: 2018-12-09

## 2018-12-09 DIAGNOSIS — C8190 Hodgkin lymphoma, unspecified, unspecified site: Secondary | ICD-10-CM

## 2018-12-09 DIAGNOSIS — M1990 Unspecified osteoarthritis, unspecified site: Secondary | ICD-10-CM

## 2018-12-09 DIAGNOSIS — R42 Dizziness and giddiness: Secondary | ICD-10-CM

## 2018-12-09 DIAGNOSIS — I10 Essential (primary) hypertension: Secondary | ICD-10-CM

## 2018-12-09 DIAGNOSIS — I4892 Unspecified atrial flutter: Secondary | ICD-10-CM

## 2018-12-09 DIAGNOSIS — E119 Type 2 diabetes mellitus without complications: Secondary | ICD-10-CM

## 2018-12-09 NOTE — Telephone Encounter
[facility] [place] nurse, Bella Kennedy, calling in regards to a scheduled visit she had today with the patient. During her assessment, she noticed a wound on R foot second digit. She states the the toenaiil on great toe is long and curving over to the second toe, stabbing it causing a puncture wound.     She is wondering if she can have orders for wound care for this.     She is wanting to apply Hydrogrel to wound bed, aquacel AG secured with gauze and tape. She would like to do dressing changes 2-3x weekly or PRN.    Will route to advise and give approval before calling nurse back. Malva Cogan, RN

## 2018-12-10 ENCOUNTER — Encounter: Admit: 2018-12-10 | Discharge: 2018-12-10

## 2018-12-10 NOTE — Progress Notes
PA approved for Glyburide tab per insurance. Malva Cogan, RN

## 2018-12-10 NOTE — Telephone Encounter
Bella Kennedy, RN for [facility] will fax orders for wound care to clinic for signature. Malva Cogan, RN

## 2018-12-16 ENCOUNTER — Encounter: Admit: 2018-12-16 | Discharge: 2018-12-16

## 2019-01-14 NOTE — Telephone Encounter
[facility] [place] nurse, Bella Kennedy, calling in regards to patient update during a scheduled nurse visit with patient. Patient states that she is having arthritis pain in her neck, and she was told that she is not able to take OTC tylenol. Will route to PCP to see if there's any alternative OTC medication she can take or if in fact she is able to take tylenol. Malva Cogan, RN

## 2019-01-16 ENCOUNTER — Encounter: Admit: 2019-01-16 | Discharge: 2019-01-16 | Payer: MEDICARE

## 2019-03-18 DEATH — deceased

## 2019-06-19 ENCOUNTER — Encounter: Admit: 2019-06-19 | Discharge: 2019-06-19 | Payer: MEDICARE

## 2019-12-29 ENCOUNTER — Encounter: Admit: 2019-12-29 | Discharge: 2019-12-29 | Payer: MEDICARE

## 2020-07-26 ENCOUNTER — Encounter: Admit: 2020-07-26 | Discharge: 2020-07-26 | Payer: MEDICARE

## 2021-03-06 IMAGING — US ECHOCOMPL
1 series · 14 of 24 positions shown · non-contrast
Comparison: none

[Series 1: us echo 2d, wo/w m-mode, compl · 83 acquisitions, 14 frames shown]
[im 1/83]
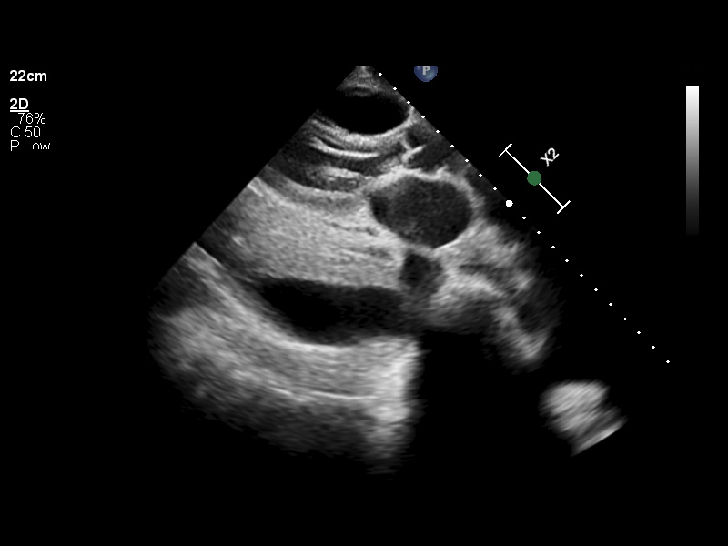
[im 8/83]
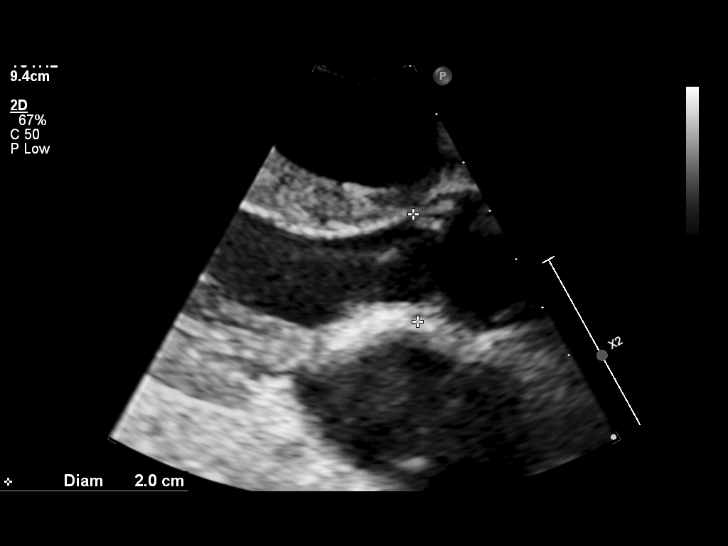
[im 15/83]
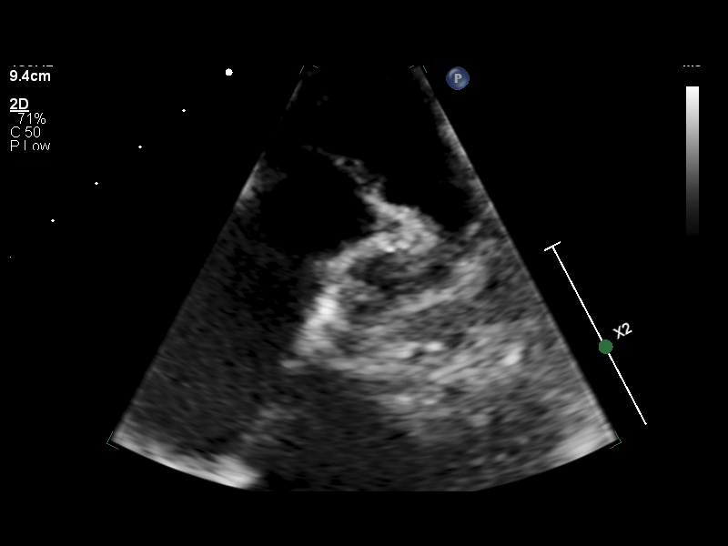
[im 18/83]
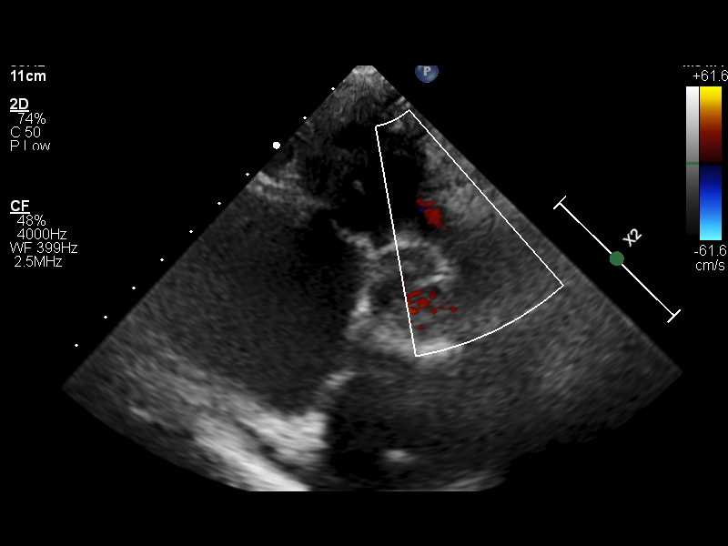
[im 25/83]
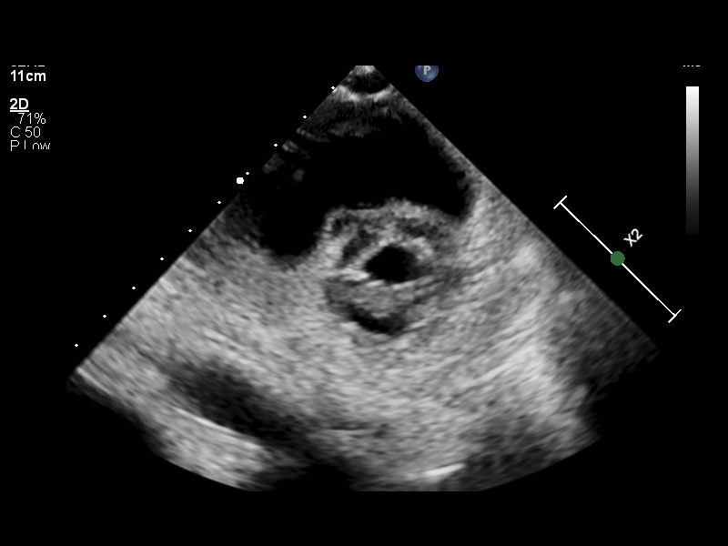
[im 29/83]
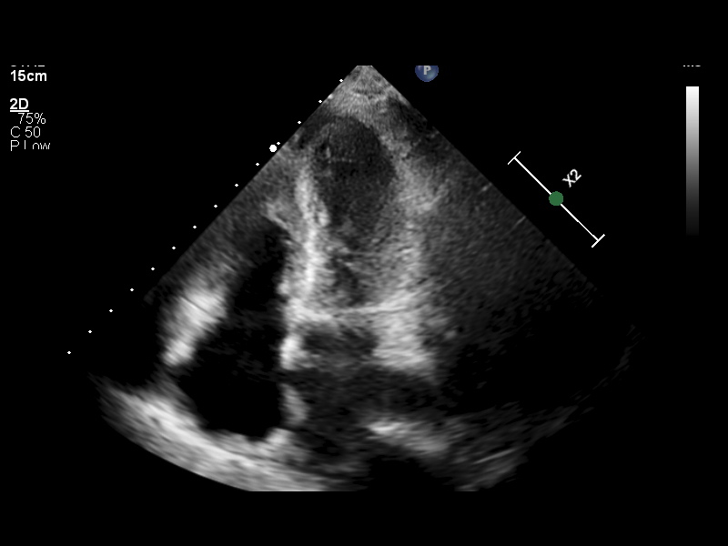
[im 36/83]
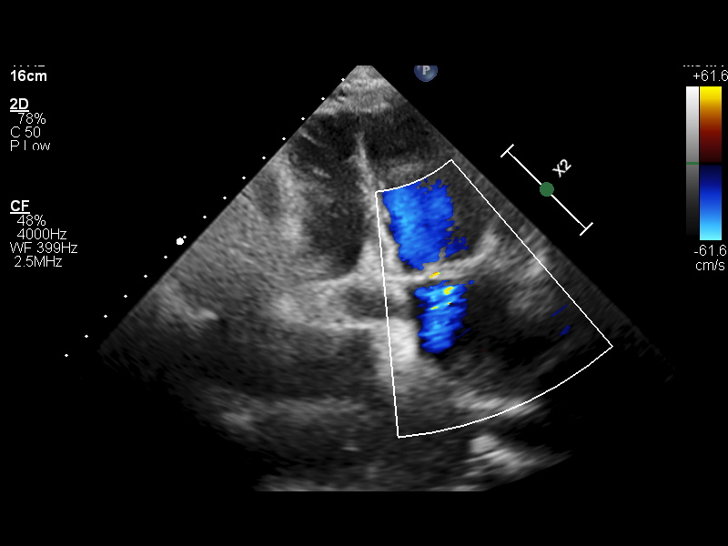
[im 36/83]
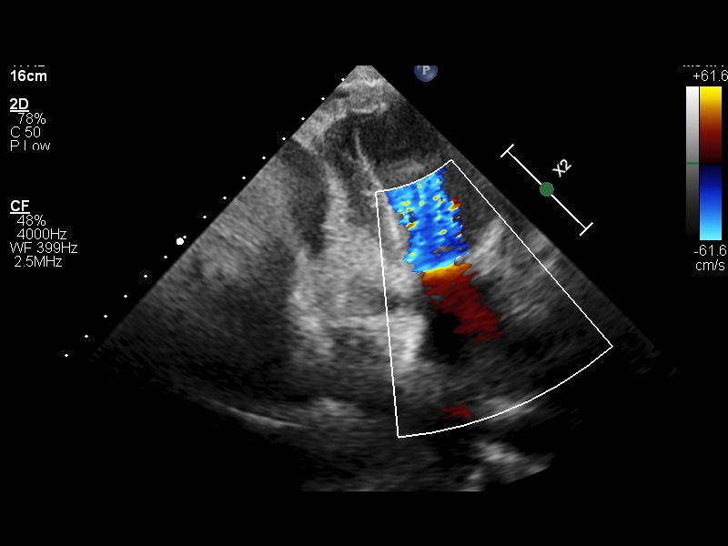
[im 47/83]
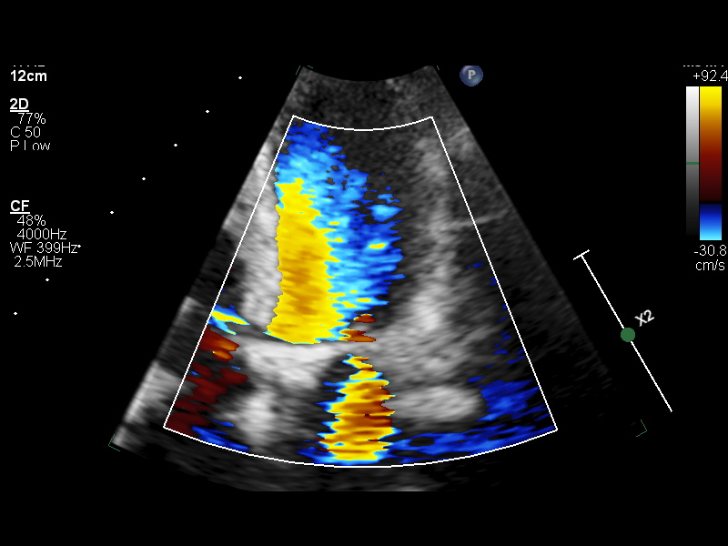
[im 54/83]
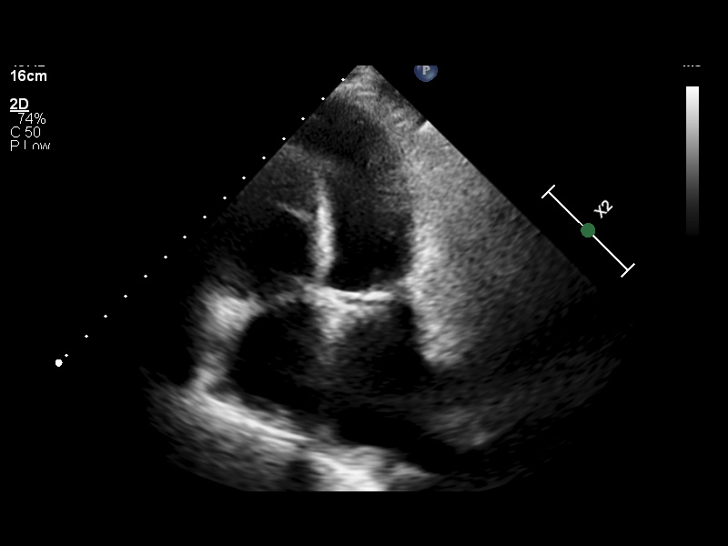
[im 61/83]
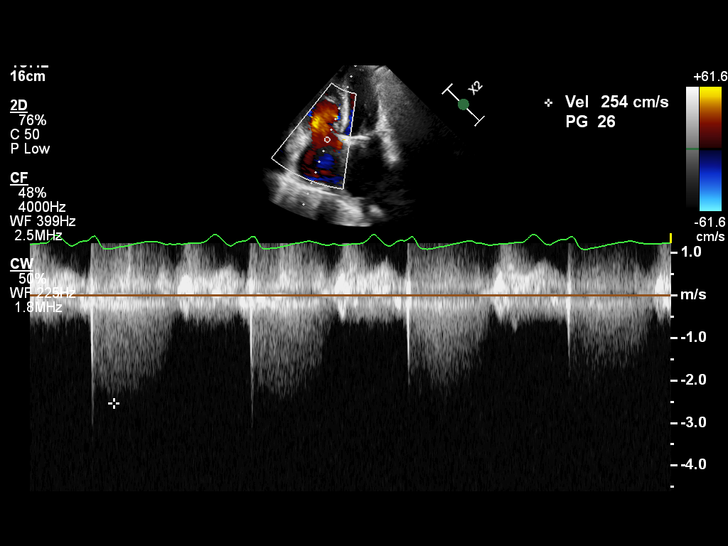
[im 65/83]
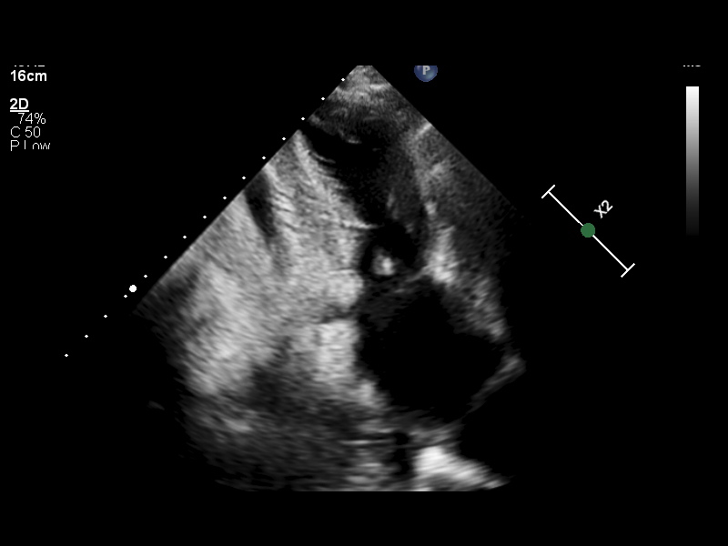
[im 72/83]
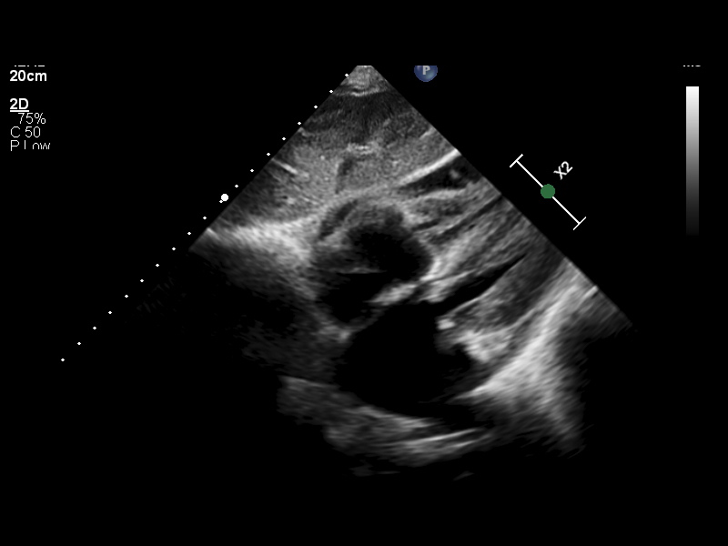
[im 83/83]
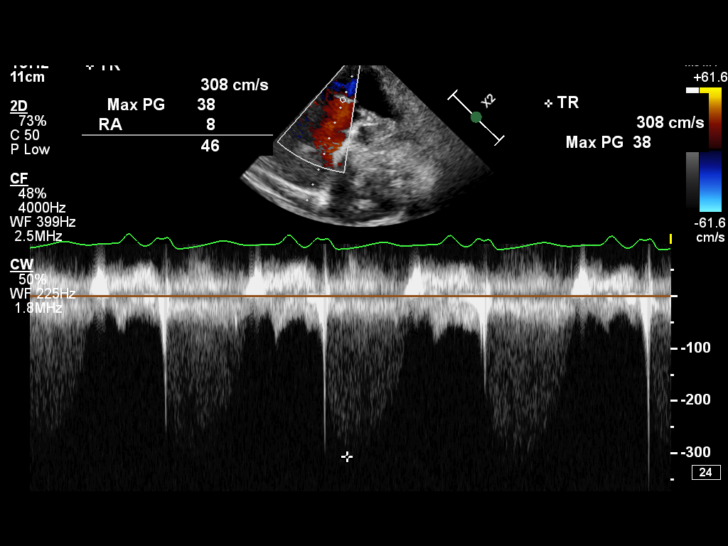

[14 of 24 positions shown; findings below may reference images not displayed]

02/05/19

FINAL REPORT IS SCANNED IN THE PATIENT'S EMR.

Tech Notes:

HYPOXIA; TACHYCARDIA; TM

## 2021-03-15 IMAGING — CR CHEST
1 series · 1 of 1 positions shown · non-contrast
Comparison: February 05, 2019.

02/14/19

DIAGNOSTIC STUDIES
EXAM:  XR CHEST, 1 VIEW  (18097)
INDICATION: difficulty breathing, chest tightness pt c/o increased SOA and chest tightness. prev
01/22/19 - [facility] ATH

[chest ap grid]
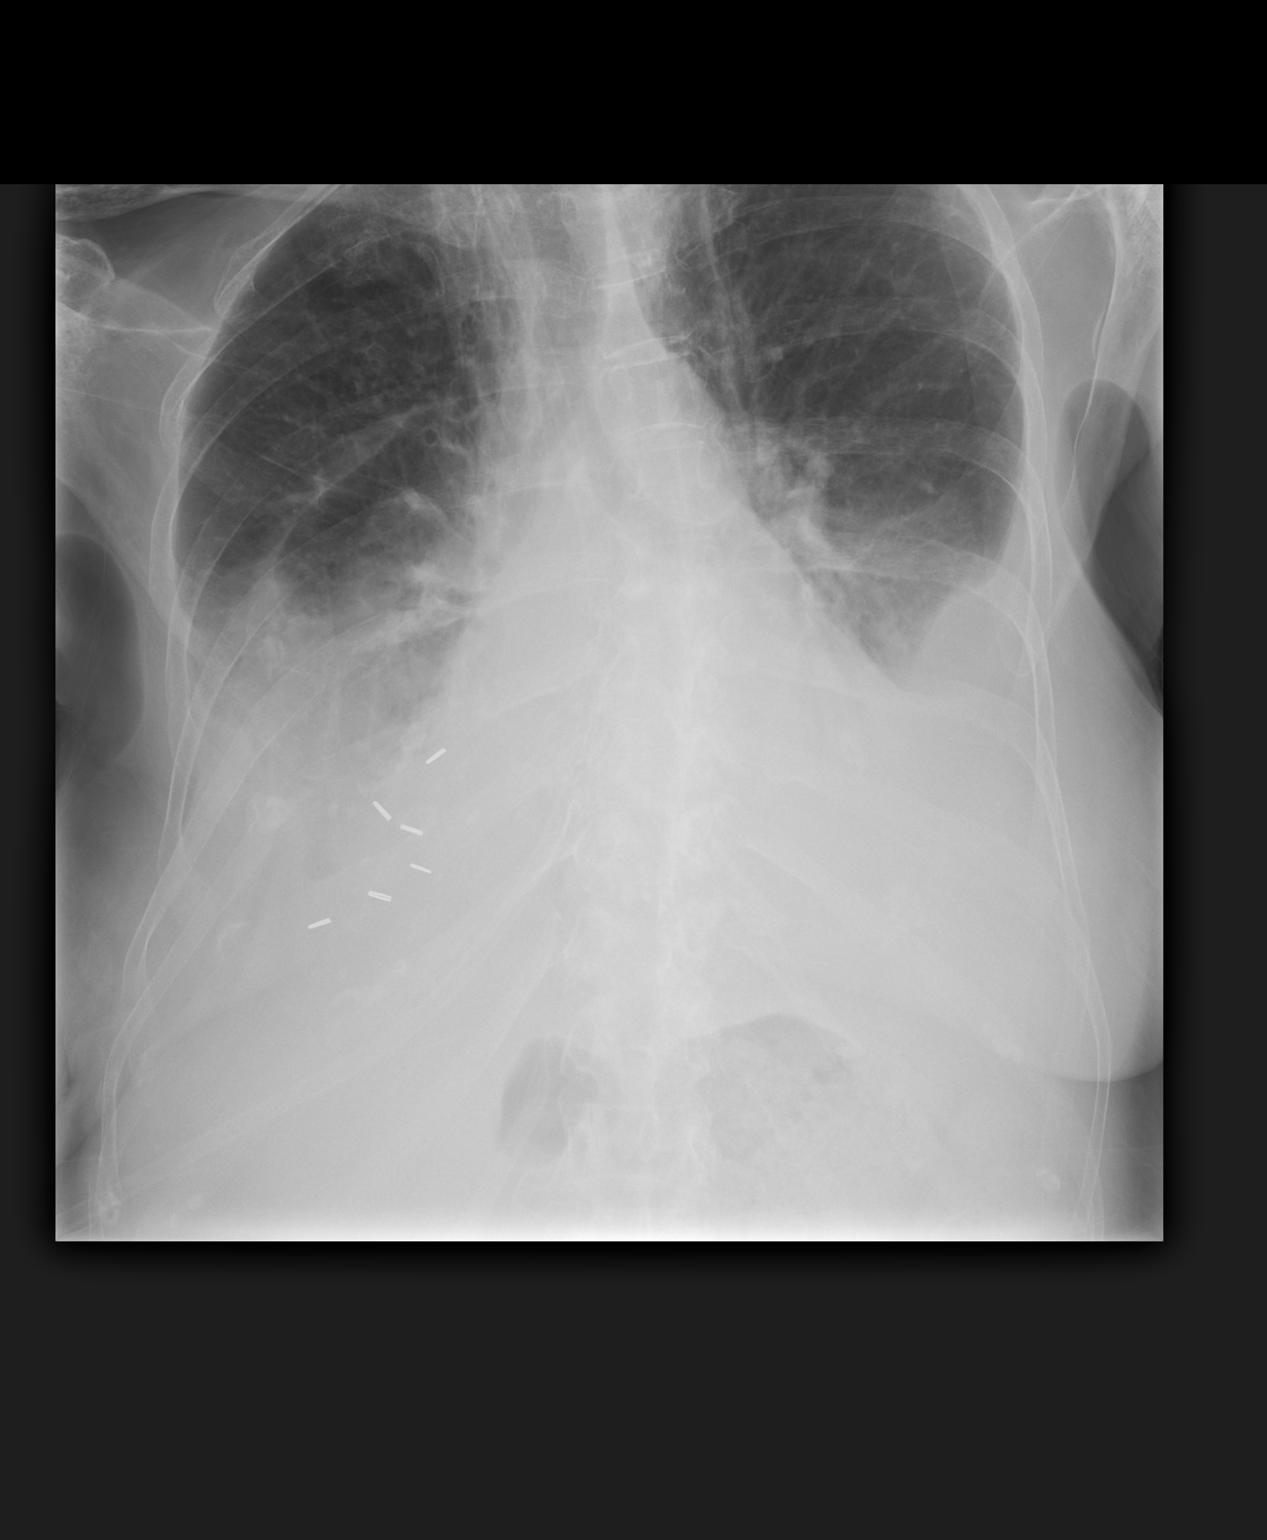

[1 of 1 positions shown; findings below may reference images not displayed]

FINDINGS: LUNGS:  On the previous examination there were areas of consolidation and pleural effusion
bilaterally. These are increased/more prominent compared with prior study bilaterally.

HEART:  Heart borders are obscured.

BONES/JOINTS:  Bony structures are stable as visualized. NO definite change from prior study.

SOFT TISSUES:  Surgical clips are present/projected over the RIGHT lower chest.
IMPRESSION: - On the previous examination there were areas of consolidation and pleural effusion bilaterally.
These are increased/more prominent compared with prior study bilaterally.  The differential includes
worsening pneumonia and/or atelectasis with pleural effusions.

Tech Notes:

pt c/o increased SOA and chest tightness. prev 01/22/19 - [facility]

## 2021-12-06 ENCOUNTER — Encounter: Admit: 2021-12-06 | Discharge: 2021-12-06 | Payer: MEDICARE
